# Patient Record
Sex: Female | Born: 1975 | Hispanic: No | Smoking: Never smoker
Health system: Southern US, Community
[De-identification: ages and names within clinical notes are randomized; demographics above are authoritative.]

## PROBLEM LIST (undated history)

## (undated) DIAGNOSIS — T7840XA Allergy, unspecified, initial encounter: Secondary | ICD-10-CM

## (undated) DIAGNOSIS — K219 Gastro-esophageal reflux disease without esophagitis: Secondary | ICD-10-CM

## (undated) HISTORY — DX: Allergy, unspecified, initial encounter: T78.40XA

## (undated) HISTORY — DX: Gastro-esophageal reflux disease without esophagitis: K21.9

## (undated) HISTORY — PX: UPPER GASTROINTESTINAL ENDOSCOPY: SHX188

## (undated) HISTORY — PX: WISDOM TOOTH EXTRACTION: SHX21

---

## 1998-12-20 ENCOUNTER — Other Ambulatory Visit: Admission: RE | Admit: 1998-12-20 | Discharge: 1998-12-20 | Payer: Self-pay | Admitting: Obstetrics and Gynecology

## 2000-05-22 ENCOUNTER — Other Ambulatory Visit: Admission: RE | Admit: 2000-05-22 | Discharge: 2000-05-22 | Payer: Self-pay | Admitting: Obstetrics and Gynecology

## 2013-04-29 ENCOUNTER — Encounter: Payer: Self-pay | Admitting: *Deleted

## 2013-05-06 ENCOUNTER — Ambulatory Visit (INDEPENDENT_AMBULATORY_CARE_PROVIDER_SITE_OTHER): Payer: 59 | Admitting: Family Medicine

## 2013-05-06 ENCOUNTER — Encounter: Payer: Self-pay | Admitting: Family Medicine

## 2013-05-06 VITALS — BP 110/60 | Ht 63.0 in | Wt 92.6 lb

## 2013-05-06 DIAGNOSIS — J309 Allergic rhinitis, unspecified: Secondary | ICD-10-CM | POA: Insufficient documentation

## 2013-05-06 MED ORDER — FLUTICASONE PROPIONATE 50 MCG/ACT NA SUSP: 1.0000 | Freq: Every day | NASAL | Status: DC

## 2013-05-06 NOTE — Progress Notes (Signed)
  Subjective:    Patient ID: Katie Hernandez, female    DOB: 07/31/1976, 37 y.o.   MRN: [id number]  HPI claritin prn for allergies. Generally good control of symptoms  History of ear infections in the past. At times develops pressure in the year. Had history of cerumen impaction also. Recent pressure would like ears checked.  Review of Systems No chest pain no shortness of breath no chronic cough ROS otherwise negative    Objective:   Physical Exam  Alert HEENT slight nasal congestion. External canal is open tympanic membranes normal pharynx normal neck supple. Lungs clear. Heart regular rate and rhythm.      Assessment & Plan:  Impression allergic rhinitis-discussed. Plan add Flonase when necessary. Symptomatic care discussed. WSL

## 2013-05-06 NOTE — Patient Instructions (Signed)
Be sure to get an annual wellness exam

## 2013-10-26 ENCOUNTER — Encounter: Payer: Self-pay | Admitting: Family Medicine

## 2013-10-26 ENCOUNTER — Ambulatory Visit (INDEPENDENT_AMBULATORY_CARE_PROVIDER_SITE_OTHER): Payer: 59 | Admitting: Family Medicine

## 2013-10-26 VITALS — BP 122/80 | Temp 98.8°F | Ht 63.0 in | Wt 94.5 lb

## 2013-10-26 DIAGNOSIS — F458 Other somatoform disorders: Secondary | ICD-10-CM | POA: Insufficient documentation

## 2013-10-26 DIAGNOSIS — M26629 Arthralgia of temporomandibular joint, unspecified side: Secondary | ICD-10-CM

## 2013-10-26 MED ORDER — AMOXICILLIN 500 MG PO CAPS: 500.0000 mg | ORAL_CAPSULE | Freq: Three times a day (TID) | ORAL | Status: DC

## 2013-10-26 NOTE — Progress Notes (Signed)
   Subjective:    Patient ID: Katie Hernandez, female    DOB: 03-31-1976, 38 y.o.   MRN: [id number]  HPI Patient states that she has been experiencing "popping in her head" for about 1 week now. She states no fever, drainage, cough, congestion, etc is noted at this time.   Had diarrheal illnes in the past  Ear and head popping sensation  Talking often feels the pop  Can feel and hear it at the sme time  Turning   No pain or discomfort  Nose blocked up at times, some cong  Allergies mostly spring and fall, some cong at times  Feels grinding sens in left, also feel it in the head And in no chest pain  Review of Systems No chest pain no back pain no abdominal pain no change in bowel habits no blood in stool no rash    Objective:   Physical Exam Anxious appearing female in no major distress. TMs normal. Slight crepitations with extension jaw pharynx normal neck supple. Lungs clear heart regular rate and rhythm. Slight nasal discharge.       Assessment & Plan:  Impression 1 possible rhinosinusitis though doubt major component. 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 Discussed at profound length. Plan patient has decided to get back with her dentist on a mouth guard. Aleve 2 tablets twice a day with food during flare. Antibiotics prescribed for possible sinus component. Highly doubt any others he serious etiology going on. Easily 25 minutes spent in lengthy discussion regarding this

## 2014-05-17 ENCOUNTER — Other Ambulatory Visit: Payer: Self-pay | Admitting: Family Medicine

## 2014-12-06 ENCOUNTER — Encounter: Payer: Self-pay | Admitting: Family Medicine

## 2014-12-06 ENCOUNTER — Ambulatory Visit (INDEPENDENT_AMBULATORY_CARE_PROVIDER_SITE_OTHER): Payer: 59 | Admitting: Nurse Practitioner

## 2014-12-06 ENCOUNTER — Encounter: Payer: Self-pay | Admitting: Nurse Practitioner

## 2014-12-06 VITALS — BP 108/80 | Temp 99.1°F | Ht 63.0 in | Wt 97.0 lb

## 2014-12-06 DIAGNOSIS — J111 Influenza due to unidentified influenza virus with other respiratory manifestations: Secondary | ICD-10-CM | POA: Diagnosis not present

## 2014-12-06 DIAGNOSIS — N912 Amenorrhea, unspecified: Secondary | ICD-10-CM | POA: Diagnosis not present

## 2014-12-06 LAB — POCT URINE PREGNANCY: Preg Test, Ur: NEGATIVE

## 2014-12-06 MED ORDER — HYDROCODONE-HOMATROPINE 5-1.5 MG/5ML PO SYRP: 5.0000 mL | ORAL_SOLUTION | ORAL | Status: DC | PRN

## 2014-12-06 MED ORDER — OSELTAMIVIR PHOSPHATE 75 MG PO CAPS: 75.0000 mg | ORAL_CAPSULE | Freq: Two times a day (BID) | ORAL | Status: DC

## 2014-12-06 NOTE — Progress Notes (Signed)
Subjective:  Presents for c/o sudden onset fever, cough and headache that started yesterday. Fatigue. No sore throat, ear pain or wheezing. Had flu vaccine.   Objective:   BP 108/80 mmHg  Temp(Src) 99.1 F (37.3 C)  Ht 5\' 3"  (1.6 m)  Wt 97 lb (43.999 kg)  BMI 17.19 kg/m2 NAD. Alert, oriented. Fatigued in appearance. TMs left nl; right retracted; normal color. Pharynx clear. Neck supple with mild adenopathy. Lungs clear. Heart RRR.  Assessment: Influenza  Amenorrhea - Plan: POCT Pregnancy, Urine, POCT Pregnancy, Urine, POCT urine pregnancy  Plan:  Meds ordered this encounter  Medications  . oseltamivir (TAMIFLU) 75 MG capsule    Sig: Take 1 capsule (75 mg total) by mouth 2 (two) times daily.    Dispense:  10 capsule    Refill:  0    Order Specific Question:  Supervising Provider    Answer:  Merlyn AlbertLUKING, WILLIAM S [[id number]]  . HYDROcodone-homatropine (HYCODAN) 5-1.5 MG/5ML syrup    Sig: Take 5 mLs by mouth every 4 (four) hours as needed.    Dispense:  120 mL    Refill:  0    Order Specific Question:  Supervising Provider    Answer:  Merlyn AlbertLUKING, WILLIAM S [[id number]]   OTC meds as directed. Reviewed symptomatic care and warning signs. Call back if worsens or persists.

## 2015-04-21 ENCOUNTER — Ambulatory Visit (INDEPENDENT_AMBULATORY_CARE_PROVIDER_SITE_OTHER): Payer: 59 | Admitting: Nurse Practitioner

## 2015-04-21 ENCOUNTER — Telehealth: Payer: Self-pay | Admitting: Nurse Practitioner

## 2015-04-21 ENCOUNTER — Encounter: Payer: Self-pay | Admitting: Nurse Practitioner

## 2015-04-21 VITALS — BP 102/76 | Ht 64.0 in | Wt 95.8 lb

## 2015-04-21 DIAGNOSIS — Z0000 Encounter for general adult medical examination without abnormal findings: Secondary | ICD-10-CM | POA: Diagnosis not present

## 2015-04-21 DIAGNOSIS — R5383 Other fatigue: Secondary | ICD-10-CM | POA: Diagnosis not present

## 2015-04-21 DIAGNOSIS — Z01419 Encounter for gynecological examination (general) (routine) without abnormal findings: Secondary | ICD-10-CM

## 2015-04-21 DIAGNOSIS — Z124 Encounter for screening for malignant neoplasm of cervix: Secondary | ICD-10-CM

## 2015-04-21 NOTE — Telephone Encounter (Signed)
Pt had PE this morning but forgot her form for [employer], brought it  Back in to be filled out. Placed on your desk to be filled out. Please  Call pt when ready for pick up

## 2015-04-21 NOTE — Telephone Encounter (Signed)
done

## 2015-04-22 ENCOUNTER — Encounter: Payer: Self-pay | Admitting: Nurse Practitioner

## 2015-04-22 LAB — LIPID PANEL
CHOL/HDL RATIO: 3.3 ratio (ref 0.0–4.4)
Cholesterol, Total: 212 mg/dL — ABNORMAL HIGH (ref 100–199)
HDL: 65 mg/dL (ref >39)
LDL CALC: 135 mg/dL — AB (ref 0–99)
TRIGLYCERIDES: 58 mg/dL (ref 0–149)
VLDL Cholesterol Cal: 12 mg/dL (ref 5–40)

## 2015-04-22 LAB — HEPATIC FUNCTION PANEL
ALBUMIN: 4.6 g/dL (ref 3.5–5.5)
ALK PHOS: 64 IU/L (ref 39–117)
ALT: 21 IU/L (ref 0–32)
AST: 19 IU/L (ref 0–40)
BILIRUBIN TOTAL: 0.3 mg/dL (ref 0.0–1.2)
Bilirubin, Direct: 0.06 mg/dL (ref 0.00–0.40)
TOTAL PROTEIN: 7.6 g/dL (ref 6.0–8.5)

## 2015-04-22 LAB — CBC WITH DIFFERENTIAL/PLATELET
BASOS ABS: 0 10*3/uL (ref 0.0–0.2)
Basos: 0 %
EOS (ABSOLUTE): 0.1 10*3/uL (ref 0.0–0.4)
Eos: 2 %
HEMATOCRIT: 41.7 % (ref 34.0–46.6)
HEMOGLOBIN: 13.4 g/dL (ref 11.1–15.9)
IMMATURE GRANULOCYTES: 0 %
Immature Grans (Abs): 0 10*3/uL (ref 0.0–0.1)
LYMPHS ABS: 1.9 10*3/uL (ref 0.7–3.1)
LYMPHS: 26 %
MCH: 28.9 pg (ref 26.6–33.0)
MCHC: 32.1 g/dL (ref 31.5–35.7)
MCV: 90 fL (ref 79–97)
MONOCYTES: 7 %
Monocytes Absolute: 0.5 10*3/uL (ref 0.1–0.9)
Neutrophils Absolute: 4.6 10*3/uL (ref 1.4–7.0)
Neutrophils: 65 %
Platelets: 334 10*3/uL (ref 150–379)
RBC: 4.63 x10E6/uL (ref 3.77–5.28)
RDW: 13.4 % (ref 12.3–15.4)
WBC: 7.2 10*3/uL (ref 3.4–10.8)

## 2015-04-22 LAB — BASIC METABOLIC PANEL
BUN/Creatinine Ratio: 15 (ref 8–20)
BUN: 11 mg/dL (ref 6–20)
CO2: 22 mmol/L (ref 18–29)
CREATININE: 0.71 mg/dL (ref 0.57–1.00)
Calcium: 9.6 mg/dL (ref 8.7–10.2)
Chloride: 100 mmol/L (ref 97–108)
GFR, EST AFRICAN AMERICAN: 124 mL/min/{1.73_m2} (ref >59)
GFR, EST NON AFRICAN AMERICAN: 108 mL/min/{1.73_m2} (ref >59)
Glucose: 94 mg/dL (ref 65–99)
POTASSIUM: 4.4 mmol/L (ref 3.5–5.2)
SODIUM: 140 mmol/L (ref 134–144)

## 2015-04-22 LAB — TSH: TSH: 1.33 u[IU]/mL (ref 0.450–4.500)

## 2015-04-22 LAB — VITAMIN D 25 HYDROXY (VIT D DEFICIENCY, FRACTURES): VIT D 25 HYDROXY: 34.5 ng/mL (ref 30.0–100.0)

## 2015-04-22 NOTE — Progress Notes (Signed)
   Subjective:    Patient ID: Katie Hernandez, female    DOB: June 20, 1976, 39 y.o.   MRN: [id number]  HPI presents for her wellness exam. Married, same sexual partner. Regular menses, normal flow. Never uses BC, has not been able to get pregnant. Healthy diet. Started exercise regimen. Regular vision and dental care.     Review of Systems  Constitutional: Positive for fatigue. Negative for fever, activity change and appetite change.  HENT: Negative for dental problem, ear pain, sinus pressure and sore throat.   Respiratory: Negative for cough, chest tightness, shortness of breath and wheezing.   Cardiovascular: Negative for chest pain.  Gastrointestinal: Negative for nausea, vomiting, abdominal pain, diarrhea, constipation and abdominal distention.  Genitourinary: Negative for dysuria, urgency, frequency, vaginal discharge, enuresis, difficulty urinating, genital sores, menstrual problem and pelvic pain.       Objective:   Physical Exam  Constitutional: She is oriented to person, place, and time. She appears well-developed. No distress.  HENT:  Right Ear: External ear normal.  Left Ear: External ear normal.  Mouth/Throat: Oropharynx is clear and moist.  Neck: Normal range of motion. Neck supple. No tracheal deviation present. No thyromegaly present.  Cardiovascular: Normal rate, regular rhythm and normal heart sounds.  Exam reveals no gallop.   No murmur heard. Pulmonary/Chest: Effort normal and breath sounds normal.  Abdominal: Soft. She exhibits no distension. There is no tenderness.  Genitourinary: Vagina normal and uterus normal. No vaginal discharge found.  External GU: no rashes or lesions. Vagina: no discharge. Cervix normal in appearance. No CMT. Bimanual exam: no tenderness or obvious masses.   Musculoskeletal: She exhibits no edema.  Lymphadenopathy:    She has no cervical adenopathy.  Neurological: She is alert and oriented to person, place, and time.  Skin: Skin is warm and  dry. No rash noted.  Psychiatric: She has a normal mood and affect. Her behavior is normal.  Vitals reviewed. Breast exam: areas of dense tissue; no masses; axillae no adenopathy.         Assessment & Plan:  Well woman exam - Plan: Pap IG w/ reflex to HPV when ASC-U, CBC with Differential/Platelet, Lipid panel, Hepatic function panel, Basic metabolic panel, TSH, Vit D  25 hydroxy (rtn osteoporosis monitoring)  Screening for cervical cancer - Plan: Pap IG w/ reflex to HPV when ASC-U  Other fatigue - Plan: CBC with Differential/Platelet, TSH, Vit D  25 hydroxy (rtn osteoporosis monitoring)  Return in about 1 year (around 04/20/2016) for physical.

## 2015-04-25 LAB — PAP IG W/ RFLX HPV ASCU: PAP SMEAR COMMENT: 0

## 2015-11-04 DIAGNOSIS — H5213 Myopia, bilateral: Secondary | ICD-10-CM | POA: Diagnosis not present

## 2015-11-04 DIAGNOSIS — H52223 Regular astigmatism, bilateral: Secondary | ICD-10-CM | POA: Diagnosis not present

## 2015-11-24 ENCOUNTER — Ambulatory Visit (INDEPENDENT_AMBULATORY_CARE_PROVIDER_SITE_OTHER): Payer: 59 | Admitting: Family Medicine

## 2015-11-24 ENCOUNTER — Encounter: Payer: Self-pay | Admitting: Family Medicine

## 2015-11-24 VITALS — BP 104/70 | Temp 98.5°F | Ht 63.0 in | Wt 98.4 lb

## 2015-11-24 DIAGNOSIS — R109 Unspecified abdominal pain: Secondary | ICD-10-CM | POA: Diagnosis not present

## 2015-11-24 LAB — POCT URINALYSIS DIPSTICK
Glucose, UA: NEGATIVE
KETONES UA: NEGATIVE
LEUKOCYTES UA: NEGATIVE
PH UA: 5
RBC UA: NEGATIVE
Spec Grav, UA: <=1.005

## 2015-11-24 MED ORDER — PANTOPRAZOLE SODIUM 40 MG PO TBEC: 40.0000 mg | DELAYED_RELEASE_TABLET | Freq: Every day | ORAL | Status: DC

## 2015-11-24 NOTE — Patient Instructions (Signed)
Please start miralax one scoop daily

## 2015-11-24 NOTE — Progress Notes (Signed)
   Subjective:    Patient ID: Katie Hernandez, female    DOB: 04/20/1976, 40 y.o.   MRN: [id number]  Abdominal Pain This is a new problem. The current episode started in the past 7 days. The pain is located in the generalized abdominal region and RUQ. Associated symptoms include constipation. Treatments tried: Aleve.   Pain very terrible this mornign   First started tue on the way to work  tue started rather suddenly  Bowels not doing so well   Constipated over the past week ,  Often can get severe pain with it, pt is a vegetarian has had a little b m but not much  No fever  No urin symptoms   ruq tend manily now   Pain was worse around the abdomen    No known hx of g b symtoms     Results for orders placed or performed in visit on 11/24/15  POCT urinalysis dipstick  Result Value Ref Range   Color, UA yellow    Clarity, UA clear    Glucose, UA Negative    Bilirubin, UA     Ketones, UA Negative    Spec Grav, UA <=1.005    Blood, UA Negative    pH, UA 5.0    Protein, UA     Urobilinogen, UA     Nitrite, UA     Leukocytes, UA Negative Negative    pain started diffusely abdomen quite severe in nature. Patient almost decided to go to the emergency room and one point. Now more in the right upper quadrant. On further history he has had constipation off-and-on for some time. No dysuria no fever no nausea. No anti-inflammatory medicine use. No alcohol use or abuse. No known history of gallbladder disease   Patient states no other concerns this visit.  Review of Systems  Gastrointestinal: Positive for abdominal pain and constipation.    no rash no dysuria ROS otherwise negative    Objective:   Physical Exam   alert vital stable  No apparent distress talkativelungs clear heart rhythm H&T normal abdomen right upper quadrant tenderness no rebound no guarding no CVA tenderness      Assessment & Plan:   impression 1  sobacute abdominal pain and background of  intermittent chronic abdominal discomfort.  severity not very impressive this evening. And decided not to some emergency room tonight for in the ER workup. Not warranted discussed.Component of constipation is present. In addition pain is localized right upper quadrant at this time very long discussion held 25 minutes most in discussion plan right upper quadrant ultrasound. X-ray of abdomen. Marital asked one scoop daily. Protonix 40 mg daily.  Blood work tomorrow at same time this [employer]

## 2015-11-25 ENCOUNTER — Other Ambulatory Visit (HOSPITAL_COMMUNITY)
Admission: RE | Admit: 2015-11-25 | Discharge: 2015-11-25 | Disposition: A | Discharge status: Home / Self Care | Payer: 59 | Source: Ambulatory Visit | Attending: Family Medicine | Admitting: Family Medicine

## 2015-11-25 ENCOUNTER — Other Ambulatory Visit: Payer: Self-pay

## 2015-11-25 ENCOUNTER — Ambulatory Visit (HOSPITAL_COMMUNITY)
Admission: RE | Admit: 2015-11-25 | Discharge: 2015-11-25 | Disposition: A | Discharge status: Home / Self Care | Payer: 59 | Source: Ambulatory Visit | Attending: Family Medicine | Admitting: Family Medicine

## 2015-11-25 ENCOUNTER — Ambulatory Visit: Payer: 59 | Admitting: Family Medicine

## 2015-11-25 DIAGNOSIS — R1011 Right upper quadrant pain: Secondary | ICD-10-CM | POA: Diagnosis not present

## 2015-11-25 DIAGNOSIS — R1031 Right lower quadrant pain: Secondary | ICD-10-CM | POA: Diagnosis not present

## 2015-11-25 DIAGNOSIS — R109 Unspecified abdominal pain: Secondary | ICD-10-CM | POA: Insufficient documentation

## 2015-11-25 LAB — CBC WITH DIFFERENTIAL/PLATELET
BASOS ABS: 0 10*3/uL (ref 0.0–0.1)
BASOS PCT: 0 %
Eosinophils Absolute: 0.1 10*3/uL (ref 0.0–0.7)
Eosinophils Relative: 1 %
HEMATOCRIT: 43.9 % (ref 36.0–46.0)
Hemoglobin: 13.7 g/dL (ref 12.0–15.0)
Lymphocytes Relative: 22 %
Lymphs Abs: 1.6 10*3/uL (ref 0.7–4.0)
MCH: 28.5 pg (ref 26.0–34.0)
MCHC: 31.2 g/dL (ref 30.0–36.0)
MCV: 91.5 fL (ref 78.0–100.0)
MONO ABS: 0.5 10*3/uL (ref 0.1–1.0)
Monocytes Relative: 7 %
NEUTROS ABS: 5.2 10*3/uL (ref 1.7–7.7)
NEUTROS PCT: 70 %
Platelets: 334 10*3/uL (ref 150–400)
RBC: 4.8 MIL/uL (ref 3.87–5.11)
RDW: 13.6 % (ref 11.5–15.5)
WBC: 7.4 10*3/uL (ref 4.0–10.5)

## 2015-11-25 LAB — HEPATIC FUNCTION PANEL
ALBUMIN: 5 g/dL (ref 3.5–5.0)
ALT: 18 U/L (ref 14–54)
AST: 21 U/L (ref 15–41)
Alkaline Phosphatase: 63 U/L (ref 38–126)
BILIRUBIN TOTAL: 0.8 mg/dL (ref 0.3–1.2)
Bilirubin, Direct: 0.1 mg/dL (ref 0.1–0.5)
Indirect Bilirubin: 0.7 mg/dL (ref 0.3–0.9)
TOTAL PROTEIN: 8.3 g/dL — AB (ref 6.5–8.1)

## 2015-11-25 LAB — AMYLASE: AMYLASE: 75 U/L (ref 28–100)

## 2015-11-25 LAB — LIPASE, BLOOD: Lipase: 48 U/L (ref 11–51)

## 2015-11-26 LAB — H. PYLORI ANTIBODY, IGG

## 2016-04-04 ENCOUNTER — Telehealth: Payer: Self-pay | Admitting: *Deleted

## 2016-04-04 ENCOUNTER — Other Ambulatory Visit: Payer: Self-pay | Admitting: *Deleted

## 2016-04-04 MED ORDER — HYDROCORTISONE 2.5 % EX CREA: TOPICAL_CREAM | Freq: Two times a day (BID) | CUTANEOUS | Status: DC

## 2016-04-04 NOTE — Telephone Encounter (Signed)
rx sent to pharm. Mother notified 

## 2016-04-04 NOTE — Telephone Encounter (Signed)
hydrocort 2.5 % cr sixty g bid affected area for both one ref

## 2016-04-04 NOTE — Telephone Encounter (Signed)
Pt has been at the beach and thinks she has a heat rash. Little red bumbs. Itching on arms and legs. Daughter has same rash. No fever. [facility] [place]

## 2016-06-18 ENCOUNTER — Encounter: Payer: Self-pay | Admitting: Family Medicine

## 2016-06-18 ENCOUNTER — Ambulatory Visit (INDEPENDENT_AMBULATORY_CARE_PROVIDER_SITE_OTHER): Payer: 59 | Admitting: Family Medicine

## 2016-06-18 VITALS — BP 98/64 | Temp 98.1°F | Ht 63.0 in | Wt 100.6 lb

## 2016-06-18 DIAGNOSIS — J040 Acute laryngitis: Secondary | ICD-10-CM | POA: Diagnosis not present

## 2016-06-18 DIAGNOSIS — J310 Chronic rhinitis: Secondary | ICD-10-CM

## 2016-06-18 DIAGNOSIS — J329 Chronic sinusitis, unspecified: Secondary | ICD-10-CM | POA: Diagnosis not present

## 2016-06-18 MED ORDER — AZITHROMYCIN 250 MG PO TABS: ORAL_TABLET | ORAL | 0 refills | Status: DC

## 2016-06-18 NOTE — Progress Notes (Signed)
   Subjective:    Patient ID: Katie Hernandez, female    DOB: [birth year], 40 y.o.   MRN: [id number]  Sinusitis  This is a new problem. The current episode started yesterday. Associated symptoms include congestion, coughing, headaches and a sore throat. (Loss of voice) Treatments tried: nasal spray.  voice yest kicked in swolen  Cough off and on  No major secretion s  clar d prn , some nasal spray   off and on with otc meds   Temples frontal h a   Neg tem    Review of Systems  HENT: Positive for congestion and sore throat.   Respiratory: Positive for cough.   Neurological: Positive for headaches.       Objective:   Physical Exam  Alert, mild malaise. Hydration good Vitals stable. frontal/ maxillary tenderness evident positive nasal congestion. pharynx normal neck supple  lungs clear/no crackles or wheezes. heart regular in rhythm Plus substantial hoarseness      Assessment & Plan:  Impression rhinosinusitis likely post viral, discussed with patient. plan antibiotics prescribed. Questions answered. Symptomatic care discussed. warning signs discussed. WSL

## 2017-04-29 ENCOUNTER — Telehealth: Payer: Self-pay | Admitting: Nurse Practitioner

## 2017-04-29 ENCOUNTER — Ambulatory Visit (INDEPENDENT_AMBULATORY_CARE_PROVIDER_SITE_OTHER): Payer: 59 | Admitting: Nurse Practitioner

## 2017-04-29 ENCOUNTER — Encounter: Payer: Self-pay | Admitting: Nurse Practitioner

## 2017-04-29 VITALS — BP 100/70 | Ht 63.0 in | Wt 100.2 lb

## 2017-04-29 DIAGNOSIS — Z01419 Encounter for gynecological examination (general) (routine) without abnormal findings: Secondary | ICD-10-CM | POA: Diagnosis not present

## 2017-04-29 DIAGNOSIS — J0100 Acute maxillary sinusitis, unspecified: Secondary | ICD-10-CM

## 2017-04-29 DIAGNOSIS — K219 Gastro-esophageal reflux disease without esophagitis: Secondary | ICD-10-CM | POA: Diagnosis not present

## 2017-04-29 MED ORDER — FLUTICASONE PROPIONATE 50 MCG/ACT NA SUSP: 2.0000 | Freq: Every day | NASAL | 5 refills | Status: DC

## 2017-04-29 MED ORDER — AZITHROMYCIN 250 MG PO TABS: ORAL_TABLET | ORAL | 0 refills | Status: DC

## 2017-04-29 MED ORDER — PANTOPRAZOLE SODIUM 40 MG PO TBEC: 40.0000 mg | DELAYED_RELEASE_TABLET | Freq: Every day | ORAL | 2 refills | Status: DC

## 2017-04-29 NOTE — Telephone Encounter (Signed)
Pt will also need refills on  fluticasone (FLONASE) 50 MCG/ACT nasal spray  pantoprazole (PROTONIX) 40 MG tablet   [facility] [place]

## 2017-04-29 NOTE — Progress Notes (Signed)
Subjective:    Patient ID: Katie Hernandez, female    DOB: [birth year], 41 y.o.   MRN: [id number]  HPI presents for her wellness exam. Married, same sexual partner. Does not use birth control. Cycles regular with normal flow. Mild headache around time of cycle. Regular vision and dental care. Not exercising as much as she has in the past. Takes Protonix only as needed. Rare reflux only with certain foods.    Review of Systems  Constitutional: Negative for activity change, appetite change, fatigue and fever.  HENT: Positive for postnasal drip, sinus pain and sinus pressure. Negative for dental problem, ear pain and sore throat.        Maxillary area sinus pressure  Respiratory: Positive for cough. Negative for chest tightness, shortness of breath and wheezing.        Slight cough.   Cardiovascular: Negative for chest pain.  Gastrointestinal: Negative for abdominal distention, abdominal pain, constipation, diarrhea, nausea and vomiting.  Genitourinary: Negative for difficulty urinating, dysuria, enuresis, frequency, genital sores, menstrual problem, pelvic pain, urgency and vaginal discharge.       Objective:   Physical Exam  Constitutional: She is oriented to person, place, and time. She appears well-developed. No distress.  HENT:  Right Ear: External ear normal.  Left Ear: External ear normal.  Mouth/Throat: Oropharynx is clear and moist.  Neck: Normal range of motion. Neck supple. No tracheal deviation present. No thyromegaly present.  Cardiovascular: Normal rate, regular rhythm and normal heart sounds.  Exam reveals no gallop.   No murmur heard. Pulmonary/Chest: Effort normal and breath sounds normal.  Abdominal: Soft. She exhibits no distension. There is no tenderness.  Genitourinary: Vagina normal and uterus normal. No vaginal discharge found.  Genitourinary Comments: External GU no rashes or lesions. Vagina no discharge. Bimanual exam no tenderness or obvious masses.    Musculoskeletal: She exhibits no edema.  Lymphadenopathy:    She has no cervical adenopathy.  Neurological: She is alert and oriented to person, place, and time.  Skin: Skin is warm and dry. No rash noted.  Psychiatric: She has a normal mood and affect. Her behavior is normal.  Vitals reviewed. Breast exam: Slightly dense tissue. No masses noted. Axillae no adenopathy.        Assessment & Plan:   Problem List Items Addressed This Visit      Digestive   Gastroesophageal reflux disease without esophagitis   Relevant Medications   pantoprazole (PROTONIX) 40 MG tablet    Other Visit Diagnoses    Well woman exam    -  Primary   Relevant Orders   Lipid panel   Hepatic function panel   Basic metabolic panel   VITAMIN D 25 Hydroxy (Vit-D Deficiency, Fractures)   Acute non-recurrent maxillary sinusitis       Relevant Medications   azithromycin (ZITHROMAX Z-PAK) 250 MG tablet   fluticasone (FLONASE) 50 MCG/ACT nasal spray      Meds ordered this encounter  Medications  . azithromycin (ZITHROMAX Z-PAK) 250 MG tablet    Sig: Take 2 tablets (500 mg) on  Day 1,  followed by 1 tablet (250 mg) once daily on Days 2 through 5.    Dispense:  6 each    Refill:  0    Order Specific Question:   Supervising Provider    Answer:   Merlyn Albert [[id number]]  . fluticasone (FLONASE) 50 MCG/ACT nasal spray    Sig: Place 2 sprays into both nostrils daily. Prn head congestion  Dispense:  16 g    Refill:  5    Order Specific Question:   Supervising Provider    Answer:   Merlyn AlbertLUKING, WILLIAM S [[id number]]  . pantoprazole (PROTONIX) 40 MG tablet    Sig: Take 1 tablet (40 mg total) by mouth daily. Prn acid reflux    Dispense:  30 tablet    Refill:  2    Order Specific Question:   Supervising Provider    Answer:   Merlyn AlbertLUKING, WILLIAM S [[id number]]   Avoid foods that cause acid reflux. Continue OTC meds as directed for sinus pressure. Strongly recommend patient consider mammogram but defers at this time. States  that she will think about it. Return in about 1 year (around 04/29/2018) for physical. Call back sooner if needed.

## 2017-04-29 NOTE — Telephone Encounter (Signed)
Seen today. 

## 2017-04-29 NOTE — Telephone Encounter (Signed)
done

## 2017-05-01 ENCOUNTER — Encounter: Payer: Self-pay | Admitting: Nurse Practitioner

## 2017-05-01 DIAGNOSIS — K219 Gastro-esophageal reflux disease without esophagitis: Secondary | ICD-10-CM | POA: Insufficient documentation

## 2017-05-01 DIAGNOSIS — Z01419 Encounter for gynecological examination (general) (routine) without abnormal findings: Secondary | ICD-10-CM | POA: Diagnosis not present

## 2017-05-02 ENCOUNTER — Encounter: Payer: Self-pay | Admitting: Nurse Practitioner

## 2017-05-02 LAB — LIPID PANEL
CHOLESTEROL TOTAL: 181 mg/dL (ref 100–199)
Chol/HDL Ratio: 3.7 ratio (ref 0.0–4.4)
HDL: 49 mg/dL (ref >39)
LDL Calculated: 120 mg/dL — ABNORMAL HIGH (ref 0–99)
Triglycerides: 60 mg/dL (ref 0–149)
VLDL CHOLESTEROL CAL: 12 mg/dL (ref 5–40)

## 2017-05-02 LAB — BASIC METABOLIC PANEL
BUN/Creatinine Ratio: 14 (ref 9–23)
BUN: 10 mg/dL (ref 6–24)
CALCIUM: 9.6 mg/dL (ref 8.7–10.2)
CO2: 24 mmol/L (ref 20–29)
Chloride: 100 mmol/L (ref 96–106)
Creatinine, Ser: 0.7 mg/dL (ref 0.57–1.00)
GFR calc Af Amer: 124 mL/min/{1.73_m2} (ref >59)
GFR calc non Af Amer: 108 mL/min/{1.73_m2} (ref >59)
GLUCOSE: 94 mg/dL (ref 65–99)
POTASSIUM: 4.6 mmol/L (ref 3.5–5.2)
SODIUM: 138 mmol/L (ref 134–144)

## 2017-05-02 LAB — HEPATIC FUNCTION PANEL
ALK PHOS: 67 IU/L (ref 39–117)
ALT: 14 IU/L (ref 0–32)
AST: 16 IU/L (ref 0–40)
Albumin: 4.6 g/dL (ref 3.5–5.5)
Bilirubin Total: 0.5 mg/dL (ref 0.0–1.2)
Bilirubin, Direct: 0.15 mg/dL (ref 0.00–0.40)
TOTAL PROTEIN: 7.3 g/dL (ref 6.0–8.5)

## 2017-05-02 LAB — VITAMIN D 25 HYDROXY (VIT D DEFICIENCY, FRACTURES): VIT D 25 HYDROXY: 30.3 ng/mL (ref 30.0–100.0)

## 2017-07-12 ENCOUNTER — Encounter: Payer: Self-pay | Admitting: Family Medicine

## 2017-07-12 ENCOUNTER — Ambulatory Visit (INDEPENDENT_AMBULATORY_CARE_PROVIDER_SITE_OTHER): Payer: 59 | Admitting: Family Medicine

## 2017-07-12 VITALS — BP 102/62 | Ht 63.0 in | Wt 100.0 lb

## 2017-07-12 DIAGNOSIS — H9202 Otalgia, left ear: Secondary | ICD-10-CM | POA: Diagnosis not present
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
## 2018-07-23 DIAGNOSIS — H5213 Myopia, bilateral: Secondary | ICD-10-CM | POA: Diagnosis not present

## 2018-07-23 DIAGNOSIS — H52223 Regular astigmatism, bilateral: Secondary | ICD-10-CM | POA: Diagnosis not present

## 2018-10-08 ENCOUNTER — Ambulatory Visit: Payer: 59 | Admitting: Family Medicine

## 2018-10-08 ENCOUNTER — Encounter: Payer: Self-pay | Admitting: Family Medicine

## 2018-10-08 VITALS — BP 112/80 | Ht 63.0 in | Wt 102.0 lb

## 2018-10-08 DIAGNOSIS — K219 Gastro-esophageal reflux disease without esophagitis: Secondary | ICD-10-CM | POA: Diagnosis not present

## 2018-10-08 DIAGNOSIS — J019 Acute sinusitis, unspecified: Secondary | ICD-10-CM | POA: Diagnosis not present

## 2018-10-08 MED ORDER — AMOXICILLIN 500 MG PO CAPS: 500.0000 mg | ORAL_CAPSULE | Freq: Three times a day (TID) | ORAL | 0 refills | Status: AC

## 2018-10-08 MED ORDER — PANTOPRAZOLE SODIUM 40 MG PO TBEC: 40.0000 mg | DELAYED_RELEASE_TABLET | Freq: Every day | ORAL | 2 refills | Status: DC

## 2018-10-08 NOTE — Progress Notes (Signed)
   Subjective:    Patient ID: Katie Hernandez, female    DOB: 1976/07/12, 43 y.o.   MRN: [id number]  HPI Patient is here today with complaints of pressure behind eyes.  Also here today with complaints of left ear pain off and on for the last two days. She has been taking Claritin D and ibuprofen, which has helped some.  5 day history of sinus pressure and left ear pain intermittently last 2 days. Reports significant nasal congestion and pressure, no rhinorrhea. No cough. No fever.   Pt also requesting refill of her protonix, states only using prn for reflux, helps control her symptoms well.    Review of Systems  Constitutional: Negative for fever.  HENT: Positive for congestion, ear pain, sinus pressure and sinus pain. Negative for ear discharge and sore throat.   Respiratory: Negative for cough, shortness of breath and wheezing.        Objective:   Physical Exam Vitals signs and nursing note reviewed.  Constitutional:      General: She is not in acute distress.    Appearance: Normal appearance. She is not toxic-appearing.  HENT:     Head: Normocephalic and atraumatic.     Ears:     Comments: Bilateral TM partially obscured by cerumen, part of TM able to be visualized appears pearly gray    Nose: Congestion present.     Right Sinus: Maxillary sinus tenderness and frontal sinus tenderness present.     Left Sinus: Maxillary sinus tenderness and frontal sinus tenderness present.  Eyes:     General:        Right eye: No discharge.        Left eye: No discharge.  Neck:     Musculoskeletal: Neck supple. No neck rigidity.  Cardiovascular:     Rate and Rhythm: Normal rate and regular rhythm.     Heart sounds: Normal heart sounds.  Pulmonary:     Effort: Pulmonary effort is normal. No respiratory distress.     Breath sounds: Normal breath sounds.  Lymphadenopathy:     Cervical: No cervical adenopathy.  Skin:    General: Skin is warm and dry.  Neurological:     Mental Status: She  is alert and oriented to person, place, and time.  Psychiatric:        Mood and Affect: Mood normal.           Assessment & Plan:  1. Acute rhinosinusitis Likely sinusitis, will treat with abx. Symptomatic care discussed. Warning signs discussed. Unable to visualize full TM, but no sign of infection visualized today. Recommended if pt continues to have ear pain she should f/u and may need referral to ENT for cerumen removal.   2. Gastroesophageal reflux disease without esophagitis - Plan: pantoprazole (PROTONIX) 40 MG tablet Pt requesting refill of protonix, states she only takes prn for reflux symptoms and is helpful. Refill given.

## 2018-11-27 ENCOUNTER — Ambulatory Visit (INDEPENDENT_AMBULATORY_CARE_PROVIDER_SITE_OTHER): Payer: 59 | Admitting: Family Medicine

## 2018-11-27 ENCOUNTER — Encounter: Payer: Self-pay | Admitting: Family Medicine

## 2018-11-27 VITALS — BP 118/70 | Ht 62.75 in | Wt 102.8 lb

## 2018-11-27 DIAGNOSIS — Z0000 Encounter for general adult medical examination without abnormal findings: Secondary | ICD-10-CM | POA: Diagnosis not present

## 2018-11-27 DIAGNOSIS — Z13220 Encounter for screening for lipoid disorders: Secondary | ICD-10-CM | POA: Diagnosis not present

## 2018-11-27 DIAGNOSIS — Z1231 Encounter for screening mammogram for malignant neoplasm of breast: Secondary | ICD-10-CM | POA: Diagnosis not present

## 2018-11-27 DIAGNOSIS — Z124 Encounter for screening for malignant neoplasm of cervix: Secondary | ICD-10-CM

## 2018-11-27 NOTE — Patient Instructions (Signed)
Preventive Care 40-64 Years, Female Preventive care refers to lifestyle choices and visits with your health care provider that can promote health and wellness. What does preventive care include?   A yearly physical exam. This is also called an annual well check.  Dental exams once or twice a year.  Routine eye exams. Ask your health care provider how often you should have your eyes checked.  Personal lifestyle choices, including: ? Daily care of your teeth and gums. ? Regular physical activity. ? Eating a healthy diet. ? Avoiding tobacco and drug use. ? Limiting alcohol use. ? Practicing safe sex. ? Taking low-dose aspirin daily starting at age 50. ? Taking vitamin and mineral supplements as recommended by your health care provider. What happens during an annual well check? The services and screenings done by your health care provider during your annual well check will depend on your age, overall health, lifestyle risk factors, and family history of disease. Counseling Your health care provider may ask you questions about your:  Alcohol use.  Tobacco use.  Drug use.  Emotional well-being.  Home and relationship well-being.  Sexual activity.  Eating habits.  Work and work environment.  Method of birth control.  Menstrual cycle.  Pregnancy history. Screening You may have the following tests or measurements:  Height, weight, and BMI.  Blood pressure.  Lipid and cholesterol levels. These may be checked every 5 years, or more frequently if you are over 50 years old.  Skin check.  Lung cancer screening. You may have this screening every year starting at age 55 if you have a 30-pack-year history of smoking and currently smoke or have quit within the past 15 years.  Colorectal cancer screening. All adults should have this screening starting at age 50 and continuing until age 75. Your health care provider may recommend screening at age 45. You will have tests every  1-10 years, depending on your results and the type of screening test. People at increased risk should start screening at an earlier age. Screening tests may include: ? Guaiac-based fecal occult blood testing. ? Fecal immunochemical test (FIT). ? Stool DNA test. ? Virtual colonoscopy. ? Sigmoidoscopy. During this test, a flexible tube with a tiny camera (sigmoidoscope) is used to examine your rectum and lower colon. The sigmoidoscope is inserted through your anus into your rectum and lower colon. ? Colonoscopy. During this test, a long, thin, flexible tube with a tiny camera (colonoscope) is used to examine your entire colon and rectum.  Hepatitis C blood test.  Hepatitis B blood test.  Sexually transmitted disease (STD) testing.  Diabetes screening. This is done by checking your blood sugar (glucose) after you have not eaten for a while (fasting). You may have this done every 1-3 years.  Mammogram. This may be done every 1-2 years. Talk to your health care provider about when you should start having regular mammograms. This may depend on whether you have a family history of breast cancer.  BRCA-related cancer screening. This may be done if you have a family history of breast, ovarian, tubal, or peritoneal cancers.  Pelvic exam and Pap test. This may be done every 3 years starting at age 21. Starting at age 30, this may be done every 5 years if you have a Pap test in combination with an HPV test.  Bone density scan. This is done to screen for osteoporosis. You may have this scan if you are at high risk for osteoporosis. Discuss your test results, treatment options,   and if necessary, the need for more tests with your health care provider. Vaccines Your health care provider may recommend certain vaccines, such as:  Influenza vaccine. This is recommended every year.  Tetanus, diphtheria, and acellular pertussis (Tdap, Td) vaccine. You may need a Td booster every 10 years.  Varicella  vaccine. You may need this if you have not been vaccinated.  Zoster vaccine. You may need this after age 38.  Measles, mumps, and rubella (MMR) vaccine. You may need at least one dose of MMR if you were born in 1957 or later. You may also need a second dose.  Pneumococcal 13-valent conjugate (PCV13) vaccine. You may need this if you have certain conditions and were not previously vaccinated.  Pneumococcal polysaccharide (PPSV23) vaccine. You may need one or two doses if you smoke cigarettes or if you have certain conditions.  Meningococcal vaccine. You may need this if you have certain conditions.  Hepatitis A vaccine. You may need this if you have certain conditions or if you travel or work in places where you may be exposed to hepatitis A.  Hepatitis B vaccine. You may need this if you have certain conditions or if you travel or work in places where you may be exposed to hepatitis B.  Haemophilus influenzae type b (Hib) vaccine. You may need this if you have certain conditions. Talk to your health care provider about which screenings and vaccines you need and how often you need them. This information is not intended to replace advice given to you by your health care provider. Make sure you discuss any questions you have with your health care provider. Document Released: 10/07/2015 Document Revised: 10/31/2017 Document Reviewed: 07/12/2015 Elsevier Interactive Patient Education  2019 [employer].

## 2018-11-27 NOTE — Progress Notes (Signed)
Subjective:    Patient ID: Katie Hernandez, female    DOB: 20-Apr-1976, 43 y.o.   MRN: [id number]  HPI The patient comes in today for a wellness visit.  A review of their health history was completed.  A review of medications was also completed.  Any needed refills; none  Eating habits: health conscious  Falls/  MVA accidents in past few months: none  Regular exercise: squats and sit ups  Specialist pt sees on regular basis: none  Preventative health issues were discussed. Regular vision and dental exams. Has not had mammogram - will get scheduled.   Additional concerns: none  LMP: around 11/10/18, regular cycles; reports noticing h/a prior to starting menses, not noticing every time. Has been ongoing for several months. Sometimes sees "eye squiggles" prior to h/a usually lasts 20-25 minutes, last time a few months ago. No blurred vision. No N/V. Reports photosensitivity. No nighttime awakenings. States ibuprofen helps relieve. Reports long-standing hx of potential migraines but has never been evaluated for these.   Sexually active, same partner. No vaginal discharge or itching. Denies problems. Declines STD screening.   Review of Systems  Constitutional: Negative for chills, fever and unexpected weight change.  HENT: Negative for congestion, ear pain, sinus pain and sore throat.   Eyes: Negative for discharge and visual disturbance.  Respiratory: Negative for cough and shortness of breath.   Cardiovascular: Negative for chest pain and palpitations.  Gastrointestinal: Negative for abdominal pain and blood in stool.  Genitourinary: Negative for difficulty urinating, hematuria, menstrual problem, pelvic pain, vaginal bleeding and vaginal discharge.  Neurological: Negative for dizziness, weakness and numbness.  Hematological: Negative for adenopathy.  Psychiatric/Behavioral: Negative for dysphoric mood and suicidal ideas.       Objective:   Physical Exam Vitals signs and nursing  note reviewed. Exam conducted with a chaperone present.  Constitutional:      General: She is not in acute distress.    Appearance: Normal appearance. She is well-developed. She is not toxic-appearing.  HENT:     Head: Normocephalic and atraumatic.     Right Ear: Tympanic membrane normal.     Left Ear: Tympanic membrane normal.     Nose: Nose normal.     Mouth/Throat:     Mouth: Mucous membranes are moist.     Pharynx: Oropharynx is clear. Uvula midline.  Eyes:     General:        Right eye: No discharge.        Left eye: No discharge.     Extraocular Movements: Extraocular movements intact.     Conjunctiva/sclera: Conjunctivae normal.     Pupils: Pupils are equal, round, and reactive to light.  Neck:     Musculoskeletal: Neck supple.     Thyroid: No thyromegaly.  Cardiovascular:     Rate and Rhythm: Normal rate and regular rhythm.     Heart sounds: Normal heart sounds. No murmur.  Pulmonary:     Effort: Pulmonary effort is normal. No respiratory distress.     Breath sounds: Normal breath sounds. No wheezing.  Chest:     Breasts: Breasts are symmetrical.        Right: Normal.        Left: Normal.  Abdominal:     General: Bowel sounds are normal. There is no distension.     Palpations: Abdomen is soft. There is no mass.     Tenderness: There is no abdominal tenderness.  Genitourinary:    General: Normal  vulva.     Vagina: Normal.     Cervix: No cervical motion tenderness, discharge or erythema.     Uterus: Normal.      Adnexa: Right adnexa normal and left adnexa normal.     Comments: No tenderness or obvious masses on bimanual exam Musculoskeletal:        General: No deformity.  Lymphadenopathy:     Cervical: No cervical adenopathy.     Upper Body:     Right upper body: No supraclavicular or axillary adenopathy.     Left upper body: No supraclavicular or axillary adenopathy.  Skin:    General: Skin is warm and dry.  Neurological:     Mental Status: She is alert and  oriented to person, place, and time.     Coordination: Coordination normal.     Gait: Gait normal.  Psychiatric:        Mood and Affect: Mood normal.        Behavior: Behavior normal.           Assessment & Plan:  1. Routine general medical examination at a health care facility - Plan: Lipid panel, Comprehensive metabolic panel, CBC with Differential/Platelet, MM DIGITAL SCREENING BILATERAL, Pap IG and HPV (high risk) DNA detection Adult wellness-complete.wellness physical was conducted today. Importance of diet and exercise were discussed in detail.  In addition to this a discussion regarding safety was also covered. We also reviewed over immunizations and gave recommendations regarding current immunization needed for age.  In addition to this additional areas were also touched on including: Preventative health exams needed:  Colonoscopy N/A Mammogram: will schedule Pap smear: done today, will notify of results.  Patient was advised yearly wellness exam. Will check basic screening lab work.   2. Screening for lipid disorders - Plan: Lipid panel  3. Screening for cervical cancer  4. Encounter for screening mammogram for breast cancer - Plan: MM DIGITAL SCREENING BILATERAL  5. Headaches Pt with hx of likely migraine h/a, she thinks associated with menstrual cycle. No red flags noted. Recommend she keep a h/a diary and try to identify triggers and bring this back in for f/u appt in 4-6 weeks to further evaluate her symptoms.

## 2018-11-28 ENCOUNTER — Encounter: Payer: Self-pay | Admitting: Family Medicine

## 2018-11-28 LAB — CBC WITH DIFFERENTIAL/PLATELET
BASOS ABS: 0 10*3/uL (ref 0.0–0.2)
BASOS: 0 %
EOS (ABSOLUTE): 0 10*3/uL (ref 0.0–0.4)
EOS: 0 %
HEMATOCRIT: 40 % (ref 34.0–46.6)
HEMOGLOBIN: 13.2 g/dL (ref 11.1–15.9)
IMMATURE GRANS (ABS): 0 10*3/uL (ref 0.0–0.1)
Immature Granulocytes: 0 %
LYMPHS ABS: 1.5 10*3/uL (ref 0.7–3.1)
LYMPHS: 17 %
MCH: 28.9 pg (ref 26.6–33.0)
MCHC: 33 g/dL (ref 31.5–35.7)
MCV: 88 fL (ref 79–97)
MONOCYTES: 6 %
Monocytes Absolute: 0.5 10*3/uL (ref 0.1–0.9)
NEUTROS ABS: 6.8 10*3/uL (ref 1.4–7.0)
NEUTROS PCT: 77 %
Platelets: 397 10*3/uL (ref 150–450)
RBC: 4.56 x10E6/uL (ref 3.77–5.28)
RDW: 12.3 % (ref 11.7–15.4)
WBC: 8.9 10*3/uL (ref 3.4–10.8)

## 2018-11-28 LAB — LIPID PANEL
CHOL/HDL RATIO: 3.3 ratio (ref 0.0–4.4)
Cholesterol, Total: 175 mg/dL (ref 100–199)
HDL: 53 mg/dL (ref >39)
LDL CALC: 111 mg/dL — AB (ref 0–99)
TRIGLYCERIDES: 54 mg/dL (ref 0–149)
VLDL Cholesterol Cal: 11 mg/dL (ref 5–40)

## 2018-11-28 LAB — COMPREHENSIVE METABOLIC PANEL
A/G RATIO: 1.8 (ref 1.2–2.2)
ALT: 20 IU/L (ref 0–32)
AST: 19 IU/L (ref 0–40)
Albumin: 4.7 g/dL (ref 3.8–4.8)
Alkaline Phosphatase: 71 IU/L (ref 39–117)
BUN/Creatinine Ratio: 14 (ref 9–23)
BUN: 9 mg/dL (ref 6–24)
Bilirubin Total: 0.7 mg/dL (ref 0.0–1.2)
CALCIUM: 9.6 mg/dL (ref 8.7–10.2)
CHLORIDE: 102 mmol/L (ref 96–106)
CO2: 24 mmol/L (ref 20–29)
Creatinine, Ser: 0.65 mg/dL (ref 0.57–1.00)
GFR calc Af Amer: 127 mL/min/{1.73_m2} (ref >59)
GFR, EST NON AFRICAN AMERICAN: 110 mL/min/{1.73_m2} (ref >59)
Globulin, Total: 2.6 g/dL (ref 1.5–4.5)
Glucose: 95 mg/dL (ref 65–99)
POTASSIUM: 4.5 mmol/L (ref 3.5–5.2)
Sodium: 140 mmol/L (ref 134–144)
Total Protein: 7.3 g/dL (ref 6.0–8.5)

## 2018-12-03 LAB — PAP IG AND HPV HIGH-RISK: HPV, HIGH-RISK: NEGATIVE

## 2018-12-05 ENCOUNTER — Ambulatory Visit (HOSPITAL_COMMUNITY): Payer: 59

## 2018-12-05 ENCOUNTER — Telehealth: Payer: Self-pay | Admitting: Family Medicine

## 2018-12-05 NOTE — Telephone Encounter (Signed)
Please advise. Thank you

## 2018-12-05 NOTE — Telephone Encounter (Signed)
Dropped off Medical History [employer] form to be filled out, Placed in [employer] in Dr.Steve's box.

## 2018-12-10 ENCOUNTER — Ambulatory Visit (HOSPITAL_COMMUNITY)
Admission: RE | Admit: 2018-12-10 | Discharge: 2018-12-10 | Disposition: A | Discharge status: Home / Self Care | Payer: 59 | Source: Ambulatory Visit | Attending: Family Medicine | Admitting: Family Medicine

## 2018-12-10 ENCOUNTER — Other Ambulatory Visit: Payer: Self-pay

## 2018-12-10 DIAGNOSIS — Z0000 Encounter for general adult medical examination without abnormal findings: Secondary | ICD-10-CM | POA: Insufficient documentation

## 2018-12-10 DIAGNOSIS — Z1231 Encounter for screening mammogram for malignant neoplasm of breast: Secondary | ICD-10-CM | POA: Insufficient documentation

## 2018-12-24 ENCOUNTER — Ambulatory Visit: Payer: 59 | Admitting: Family Medicine

## 2018-12-25 ENCOUNTER — Ambulatory Visit: Payer: 59 | Admitting: Family Medicine

## 2019-01-28 ENCOUNTER — Telehealth: Payer: Self-pay | Admitting: Family Medicine

## 2019-01-28 ENCOUNTER — Other Ambulatory Visit: Payer: Self-pay

## 2019-01-28 MED ORDER — LORATADINE-PSEUDOEPHEDRINE ER 5-120 MG PO TB12: 1.0000 | ORAL_TABLET | ORAL | 5 refills | Status: DC | PRN

## 2019-01-28 MED ORDER — LORATADINE-PSEUDOEPHEDRINE ER 10-240 MG PO TB24: ORAL_TABLET | ORAL | 5 refills | Status: DC

## 2019-01-28 NOTE — Telephone Encounter (Signed)
Wellness on 11/27/18

## 2019-01-28 NOTE — Telephone Encounter (Signed)
Pt states pharmacy told her to get primary care to order Clairitn-D with a prescription so that she can get a month's supply  Pt bought box of 20 & pharmacy states she can't get anymore for 30 days without order from doctor  Please advise & call pt   [facility]

## 2019-01-28 NOTE — Telephone Encounter (Signed)
Medication sent in and pt is aware  

## 2019-01-28 NOTE — Telephone Encounter (Signed)
Sure plus 6 ref 

## 2019-05-13 ENCOUNTER — Telehealth: Payer: Self-pay | Admitting: *Deleted

## 2019-05-13 NOTE — Telephone Encounter (Signed)
Pt dropped off form she needs filled out. Last wellness march 2020. Form in dr steve's folder.

## 2019-05-14 NOTE — Telephone Encounter (Signed)
Form completed and given to pt today

## 2019-05-19 ENCOUNTER — Telehealth: Payer: Self-pay | Admitting: Family Medicine

## 2019-05-19 MED ORDER — FLUTICASONE PROPIONATE 50 MCG/ACT NA SUSP: 2.0000 | Freq: Every day | NASAL | 11 refills | Status: DC

## 2019-05-19 MED FILL — PANTOPRAZOLE SOD DR 40 MG T: 40 | 30 days supply | Qty: 30 | Fill #0

## 2019-05-19 MED FILL — FLUTICASONE PROP 50 MCG SPR: 50 | 30 days supply | Qty: 16 | Fill #0

## 2019-05-19 NOTE — Telephone Encounter (Signed)
Patient is requesting refill on Flonase to be called in [place]

## 2019-05-19 NOTE — Telephone Encounter (Signed)
Ok 1000 ref

## 2019-05-19 NOTE — Telephone Encounter (Signed)
Prescription sent electronically to pharmacy. 

## 2019-08-05 ENCOUNTER — Other Ambulatory Visit: Payer: Self-pay

## 2019-08-05 ENCOUNTER — Encounter: Payer: Self-pay | Admitting: Family Medicine

## 2019-08-05 ENCOUNTER — Ambulatory Visit (INDEPENDENT_AMBULATORY_CARE_PROVIDER_SITE_OTHER): Payer: 59 | Admitting: Family Medicine

## 2019-08-05 DIAGNOSIS — J019 Acute sinusitis, unspecified: Secondary | ICD-10-CM

## 2019-08-05 MED ORDER — AMOXICILLIN-POT CLAVULANATE 875-125 MG PO TABS: ORAL_TABLET | ORAL | 0 refills | Status: DC

## 2019-08-05 NOTE — Progress Notes (Signed)
   Subjective:    Patient ID: Katie Hernandez, female    DOB: 24-Jul-1976, 43 y.o.   MRN: [id number]  Sinus Problem This is a new problem. The current episode started 1 to 4 weeks ago. Associated symptoms include headaches and sinus pressure.      Review of Systems  HENT: Positive for sinus pressure.   Neurological: Positive for headaches.   Virtual Visit via Video Note  I connected with Katie Hernandez on 08/05/19 at  8:40 AM EST by a video enabled telemedicine application and verified that I am speaking with the correct person using two identifiers.  Location: Patient: home Provider: office   I discussed the limitations of evaluation and management by telemedicine and the availability of in person appointments. The patient expressed understanding and agreed to proceed.  History of Present Illness:    Observations/Objective:   Assessment and Plan:   Follow Up Instructions:    I discussed the assessment and treatment plan with the patient. The patient was provided an opportunity to ask questions and all were answered. The patient agreed with the plan and demonstrated an understanding of the instructions.   The patient was advised to call back or seek an in-person evaluation if the symptoms worsen or if the condition fails to improve as anticipated.  I provided 20 minutes of non-face-to-face time during this encounter.  Over a month or so  Over a week has had frontal pressure  Sinus involvent   No onee sick  All up top  No fever         Objective:   Physical Exam  Virtual      Assessment & Plan:  Impression subacute rhinosinusitis.  Discussed.  Augmentin twice daily 10 days.  Flonase use regularly symptom care discussed.  Covid due to protracted nature along with no throat or respiratory features.  Warning signs discussed

## 2019-10-26 ENCOUNTER — Encounter: Payer: Self-pay | Admitting: Family Medicine

## 2020-01-06 ENCOUNTER — Other Ambulatory Visit (HOSPITAL_COMMUNITY): Payer: Self-pay | Admitting: Family Medicine

## 2020-01-06 DIAGNOSIS — Z1231 Encounter for screening mammogram for malignant neoplasm of breast: Secondary | ICD-10-CM

## 2020-01-07 MED FILL — FLUTICASONE PROP 50 MCG SPR: 50 | 30 days supply | Qty: 16 | Fill #1

## 2020-01-08 ENCOUNTER — Other Ambulatory Visit: Payer: Self-pay | Admitting: *Deleted

## 2020-01-08 DIAGNOSIS — K219 Gastro-esophageal reflux disease without esophagitis: Secondary | ICD-10-CM

## 2020-01-08 MED ORDER — PANTOPRAZOLE SODIUM 40 MG PO TBEC: 40.0000 mg | DELAYED_RELEASE_TABLET | Freq: Every day | ORAL | 0 refills | Status: DC

## 2020-01-08 MED FILL — PANTOPRAZOLE SOD DR 40 MG T: 40 | 30 days supply | Qty: 30 | Fill #0

## 2020-01-11 ENCOUNTER — Other Ambulatory Visit: Payer: Self-pay

## 2020-01-11 ENCOUNTER — Ambulatory Visit (HOSPITAL_COMMUNITY)
Admission: RE | Admit: 2020-01-11 | Discharge: 2020-01-11 | Disposition: A | Discharge status: Home / Self Care | Payer: 59 | Source: Ambulatory Visit | Attending: Family Medicine | Admitting: Family Medicine

## 2020-01-11 DIAGNOSIS — Z1231 Encounter for screening mammogram for malignant neoplasm of breast: Secondary | ICD-10-CM | POA: Diagnosis not present

## 2020-01-13 ENCOUNTER — Telehealth: Payer: Self-pay | Admitting: Family Medicine

## 2020-01-13 MED ORDER — FLUTICASONE PROPIONATE 50 MCG/ACT NA SUSP: 2.0000 | Freq: Every day | NASAL | 6 refills | Status: AC

## 2020-01-13 NOTE — Telephone Encounter (Signed)
Patient is requesting refill on flonase to be called into [facility]

## 2020-01-13 NOTE — Telephone Encounter (Signed)
Pt last seen 08/05/2019 for rhinosinusitis. Please advise. Thank you

## 2020-01-13 NOTE — Telephone Encounter (Signed)
Ok plus 6 ref 

## 2020-01-13 NOTE — Telephone Encounter (Signed)
Medication sent to pharmacy and pt is aware 

## 2020-01-27 ENCOUNTER — Telehealth: Payer: Self-pay | Admitting: Family Medicine

## 2020-01-27 NOTE — Telephone Encounter (Signed)
Patient notified

## 2020-01-27 NOTE — Telephone Encounter (Signed)
Madhuri is calling back stating [facility] will not discharge until pt has an appt. She is hoping to know soon so if she needs to call somewhere else she can to get him established.  CB# [phone]

## 2020-01-27 NOTE — Telephone Encounter (Signed)
Sorry no new patients at this time for me. At all. We are in a bit of a challenge on this side too

## 2020-01-27 NOTE — Telephone Encounter (Signed)
Patient is wanting to know if you would take on her new born foster child. I explained to her we were not taking new patient at this time due to Dr.Taylor trying to get establish with your patient but I told her I would send back a message to ask. Katie Hernandez Hunter-12/21/19 ,he is being discharged today from [facility]. He is needing appointment this week . Please advise

## 2020-05-26 ENCOUNTER — Other Ambulatory Visit: Payer: Self-pay

## 2020-05-26 ENCOUNTER — Encounter: Payer: Self-pay | Admitting: Gastroenterology

## 2020-05-26 ENCOUNTER — Encounter: Payer: Self-pay | Admitting: Family Medicine

## 2020-05-26 ENCOUNTER — Ambulatory Visit (INDEPENDENT_AMBULATORY_CARE_PROVIDER_SITE_OTHER): Payer: BC Managed Care – PPO | Admitting: Family Medicine

## 2020-05-26 ENCOUNTER — Telehealth: Payer: Self-pay | Admitting: *Deleted

## 2020-05-26 ENCOUNTER — Other Ambulatory Visit: Payer: Self-pay | Admitting: *Deleted

## 2020-05-26 VITALS — BP 122/74 | HR 87 | Temp 97.2°F | Ht 62.75 in | Wt 107.2 lb

## 2020-05-26 DIAGNOSIS — J302 Other seasonal allergic rhinitis: Secondary | ICD-10-CM

## 2020-05-26 DIAGNOSIS — J019 Acute sinusitis, unspecified: Secondary | ICD-10-CM | POA: Diagnosis not present

## 2020-05-26 DIAGNOSIS — K219 Gastro-esophageal reflux disease without esophagitis: Secondary | ICD-10-CM

## 2020-05-26 MED ORDER — AMOXICILLIN-POT CLAVULANATE 600-42.9 MG/5ML PO SUSR: ORAL | 0 refills | Status: DC

## 2020-05-26 MED ORDER — PANTOPRAZOLE SODIUM 40 MG PO PACK: PACK | ORAL | 5 refills | Status: DC

## 2020-05-26 NOTE — Telephone Encounter (Signed)
Brandon from [facility] calling to confirm dosage of the augmentin.  [phone].

## 2020-05-26 NOTE — Patient Instructions (Signed)
Take the liquid form of Protonix daily Ask the pharmacist for a liquid allergy medication and take daily Take Augmentin for sinus infection. We will start with GI referral.    Barrett's Esophagus  Barrett's esophagus occurs when the tissue that lines the esophagus changes or becomes damaged. The esophagus is the tube that carries food from the throat to the stomach. With Barrett's esophagus, the cells that line the esophagus are replaced by cells that are similar to the lining of the intestines (intestinal metaplasia). Barrett's esophagus itself may not cause any symptoms. However, many people who have Barrett's esophagus also have gastroesophageal reflux disease (GERD), which may cause symptoms such as heartburn. Over time, a few people with this condition may develop cancer of the esophagus. Treatment may include medicines, procedures to destroy the abnormal cells, or surgery. What are the causes? The exact cause of this condition is not known. In some cases, the condition develops from damage to the lining of the esophagus caused by GERD. GERD occurs when stomach acids flow up from the stomach into the esophagus. Frequent symptoms of GERD may cause intestinal metaplasia or cause cell changes (dysplasia). What increases the risk? You are more likely to develop this condition if you:  Have GERD.  Are female.  Are Caucasian.  Are obese.  Are older than 50.  Have a hiatal hernia. This is a condition in which part of your stomach bulges into your chest.  Smoke. What are the signs or symptoms? People with Barrett's esophagus often have no symptoms. However, many people with this condition also have GERD. Symptoms of GERD may include:  Heartburn.  Difficulty swallowing.  Dry cough. How is this diagnosed? This condition may be diagnosed based on:  Results of an upper gastrointestinal endoscopy. For this exam, a thin, flexible tube with a light and a camera on the end (endoscope) is  passed down your esophagus. Your health care provider can view the inside of your esophagus during this procedure.  Results of a biopsy. For this procedure, several tissue samples are removed (biopsy) from your esophagus. They are then checked for intestinal metaplasia or dysplasia. How is this treated? Treatment for this condition may include:  Medicines (proton pump inhibitors, or PPIs) to decrease or stop GERD.  Periodic endoscopic exams to make sure that cancer is not developing.  A procedure or surgery for dysplasia. This may include: ? Removal or destruction of abnormal cells. ? Removal of part of the esophagus (esophagectomy). Follow these instructions at home: Eating and drinking  Eat more fruits and vegetables.  Avoid fatty foods.  Eat small, frequent meals instead of large meals.  Avoid foods that cause heartburn. These foods include: ? Coffee and alcoholic drinks. ? Tomatoes and foods made with tomatoes. ? Greasy or spicy foods. ? Chocolate and peppermint.  Do not drink alcohol. General instructions  Take over-the-counter and prescription medicines only as told by your health care provider.  Do not use any products that contain nicotine or tobacco, such as cigarettes and e-cigarettes. If you need help quitting, ask your health care provider.  If you are being treated for GERD, make sure you take medicines and follow all instructions as told by your health care provider.  Keep all follow-up visits as told by your health care provider. This is important. Contact a health care provider if:  You have heartburn or GERD symptoms.  You have difficulty swallowing. Get help right away if:  You have chest pain.  You are unable  to swallow.  You vomit blood or material that looks like coffee grounds.  Your stool (feces) is bright red or dark. Summary  Barrett's esophagus occurs when the tissue that lines the esophagus changes or becomes damaged.  Barrett's  esophagus may be diagnosed with an upper gastrointestinal endoscopy and a biopsy.  Treatment may include medicines, procedures to remove abnormal cells, or surgery.  Follow your health care provider's instructions about what to eat and drink, what medicines to take, and when to call for help. This information is not intended to replace advice given to you by your health care provider. Make sure you discuss any questions you have with your health care provider. Document Revised: 01/06/2018 Document Reviewed: 01/06/2018 Elsevier Patient Education  2020 [employer].

## 2020-05-26 NOTE — Telephone Encounter (Signed)
Discussed with karen and phillip at [facility] notified.

## 2020-05-26 NOTE — Progress Notes (Signed)
Patient ID: Katie Hernandez, female    DOB: Aug 11, 1976, 44 y.o.   MRN: [id number]   Chief Complaint  Patient presents with  . Gastroesophageal Reflux   Subjective:    HPI  Pt having reflux issues. Pt states she is burping more than usual. Pt also states sometimes she feels that she need to make herself burp. Pt states she has not been taking Protonix because she is unable to swallow them.   Medical History Katie Hernandez has no past medical history on file.   Outpatient Encounter Medications as of 05/26/2020  Medication Sig  . fluticasone (FLONASE) 50 MCG/ACT nasal spray Place 2 sprays into both nostrils daily. Prn head congestion  . loratadine-pseudoephedrine (CLARITIN-D 12-HOUR) 5-120 MG tablet Take 1 tablet by mouth as needed for allergies.  . [DISCONTINUED] pantoprazole (PROTONIX) 40 MG tablet Take 1 tablet (40 mg total) by mouth daily. Prn acid reflux  . amoxicillin-clavulanate (AUGMENTIN ES-600) 600-42.9 MG/5ML suspension Needs 875/125 in suspension.  . [DISCONTINUED] amoxicillin-clavulanate (AUGMENTIN) 875-125 MG tablet One bid for 10 d  . [DISCONTINUED] pantoprazole sodium (PROTONIX) 40 mg/20 mL PACK Take 40 mg daily. Needs liquid due to inability to swallow tablets.   No facility-administered encounter medications on file as of 05/26/2020.     Review of Systems  Constitutional: Negative for chills and fever.  HENT: Positive for sinus pressure, sinus pain and trouble swallowing.        Difficulty swallowing related to taking pills and feeling like things get "stuck"  Eyes: Negative.   Respiratory: Negative.   Cardiovascular: Negative.   Gastrointestinal: Negative.   Musculoskeletal: Negative.   Skin: Negative.   Allergic/Immunologic: Positive for environmental allergies.  Neurological: Negative.   Hematological: Positive for adenopathy.  Psychiatric/Behavioral: Negative.      Vitals BP 122/74   Pulse 87   Temp (!) 97.2 F (36.2 C)   Ht 5' 2.75" (1.594 m)   Wt 107 lb  3.2 oz (48.6 kg)   SpO2 100%   BMI 19.14 kg/m   Objective:   Physical Exam Vitals and nursing note reviewed.  Constitutional:      General: She is not in acute distress.    Appearance: Normal appearance. She is normal weight. She is not ill-appearing.  HENT:     Right Ear: There is impacted cerumen.     Left Ear: Tympanic membrane is erythematous.     Nose:     Left Sinus: Maxillary sinus tenderness present.     Mouth/Throat:     Lips: Pink.     Mouth: Mucous membranes are moist.     Pharynx: Posterior oropharyngeal erythema present. No pharyngeal swelling, oropharyngeal exudate or uvula swelling.     Comments: No problems with swallowing secretions/saliva today in office. No distress. Cardiovascular:     Rate and Rhythm: Normal rate and regular rhythm.     Pulses: Normal pulses.     Heart sounds: Normal heart sounds.  Pulmonary:     Effort: Pulmonary effort is normal.     Breath sounds: Normal breath sounds.  Skin:    General: Skin is warm and dry.  Neurological:     Mental Status: She is alert and oriented to person, place, and time.  Psychiatric:        Mood and Affect: Mood normal.        Behavior: Behavior normal.      Assessment and Plan   1. Acute rhinosinusitis - amoxicillin-clavulanate (AUGMENTIN ES-600) 600-42.9 MG/5ML suspension; Needs 875/125  in suspension.  Dispense: 200 mL; Refill: 0  2. Gastroesophageal reflux disease, unspecified whether esophagitis present - Ambulatory referral to Gastroenterology  3. Seasonal allergies  Consult with pharmacist on recommendation for liquid allergy medicaiton OTC.  Katie Hernandez presents today with sinus pain/tenderness, ongoing reflux (untreated due to inability to swallow tablets), and "feels like something is sticking in throat" at times.   Her left ear is erythematous and her throat is erythematous as well as maxillary facial tenderness and pain.   Consulted with [place] on proper dosing for  Augmentin 875/125 mg susp and changed Protonix to Nexium 40 mg granules due to insurance coverage. Verbal orders given to make adjustments on their side.   With history of untreated GERD and feeling of something sticking in the throat, will refer to GI for further management to prevent future issues.  Agrees with plan of care discussed today. Understands warning signs to seek further care: inability to swallow.  Understands to follow-up if symptoms do not improve or if anything changes, however, she will see GI specialist for further evaluation of GERD.    Novella Olive, NP 05/26/2020

## 2020-07-04 ENCOUNTER — Encounter: Payer: Self-pay | Admitting: Gastroenterology

## 2020-07-04 ENCOUNTER — Ambulatory Visit: Payer: BC Managed Care – PPO | Admitting: Gastroenterology

## 2020-07-04 VITALS — BP 126/80 | HR 77 | Ht 63.0 in | Wt 108.5 lb

## 2020-07-04 DIAGNOSIS — R1310 Dysphagia, unspecified: Secondary | ICD-10-CM | POA: Diagnosis not present

## 2020-07-04 DIAGNOSIS — R142 Eructation: Secondary | ICD-10-CM

## 2020-07-04 NOTE — Patient Instructions (Signed)
If you are age 44 or older, your body mass index should be between 23-30. Your Body mass index is 19.22 kg/m. If this is out of the aforementioned range listed, please consider follow up with your Primary Care Provider.  If you are age 26 or younger, your body mass index should be between 19-25. Your Body mass index is 19.22 kg/m. If this is out of the aformentioned range listed, please consider follow up with your Primary Care Provider.   You have been scheduled for an endoscopy. Please follow written instructions given to you at your visit today. If you use inhalers (even only as needed), please bring them with you on the day of your procedure.  Due to recent changes in healthcare laws, you may see the results of your imaging and laboratory studies on MyChart before your provider has had a chance to review them.  We understand that in some cases there may be results that are confusing or concerning to you. Not all laboratory results come back in the same time frame and the provider may be waiting for multiple results in order to interpret others.  Please give [place] 48 hours in order for your provider to thoroughly review all the results before contacting the office for clarification of your results.

## 2020-07-04 NOTE — Progress Notes (Signed)
     07/04/2020 Katie Hernandez [id number] June 22, 1976   HISTORY OF PRESENT ILLNESS:  This is a pleasant 44 year old female who is new to our office.  She has very limited past medical history.  She is here today at the request of Dorena Bodo, NP, for evaluation regarding issues with presumed GERD.  The patient tells me that over the past few months she started developing sensation that food is getting stuck when she eats.  She says that has particularly worsened over the past month or so.  Never had any issues similar to this in the past.  She has no problems swallowing liquids.  She says that when this happens it feels like there is an air bubble or something in her esophagus.  She reports a lot of belching.  She is on Nexium 40 mg daily currently for about the past month or so.  She is opening that capsule to take the medication.  Previously she had been on pantoprazole.   Past Medical History:  Diagnosis Date  . GERD (gastroesophageal reflux disease)    History reviewed. No pertinent surgical history.  reports that she has never smoked. She has never used smokeless tobacco. She reports that she does not drink alcohol and does not use drugs. family history is not on file. No Known Allergies    Outpatient Encounter Medications as of 07/04/2020  Medication Sig  . desloratadine (CLARINEX) 0.5 MG/ML syrup Take 5 mg by mouth as needed.  Marland Kitchen esomeprazole (NEXIUM) 40 MG capsule Take 40 mg by mouth daily. Opens capsule to swallow.  . fluticasone (FLONASE) 50 MCG/ACT nasal spray Place 2 sprays into both nostrils daily. Prn head congestion  . [DISCONTINUED] loratadine-pseudoephedrine (CLARITIN-D 12-HOUR) 5-120 MG tablet Take 1 tablet by mouth as needed for allergies.  . [DISCONTINUED] amoxicillin-clavulanate (AUGMENTIN ES-600) 600-42.9 MG/5ML suspension Needs 875/125 in suspension. (Patient not taking: Reported on 07/04/2020)   No facility-administered encounter medications on file as of 07/04/2020.      REVIEW OF SYSTEMS  : All other systems reviewed and negative except where noted in the History of Present Illness.  PHYSICAL EXAM: BP 126/80   Pulse 77   Ht 5\' 3"  (1.6 m)   Wt 108 lb 8 oz (49.2 kg)   SpO2 97%   BMI 19.22 kg/m  General: Well developed white female in no acute distress Head: Normocephalic and atraumatic Eyes:  Sclerae anicteric, conjunctiva pink. Ears: Normal auditory acuity Lungs: Clear throughout to auscultation; no W/R/R. Heart: Regular rate and rhythm; no M/R/G. Abdomen: Soft, non-distended.  BS present.  Non-tender. Rectal:  Will be done at the time of colonoscopy. Musculoskeletal: Symmetrical with no gross deformities  Skin: No lesions on visible extremities Extremities: No edema  Neurological: Alert oriented x 4, grossly non-focal Psychological:  Alert and cooperative. Normal mood and affect  ASSESSMENT AND PLAN: *Dysphagia and belching:  Symptoms new over the past few months and worsening over the past month or so.  Dysphagia is to solid food only.  Will plan for EGD with possible dilation with Dr. to rule out stricture, Schatzki's ring, esophagitis, EoE, etc.  She has been on PPI therapy, currently in the form of Nexium 40 mg daily.  We will continue that for now.  The risks, benefits, and alternatives to EGD with possible dilation were discussed with the patient and she consents to proceed.   CC:  Orvan Falconer, NP

## 2020-07-04 NOTE — Progress Notes (Signed)
Reviewed and agree with management plans. ? ?Harbor Paster L. Clodagh Odenthal, MD, MPH  ?

## 2020-07-05 ENCOUNTER — Encounter: Payer: Self-pay | Admitting: Gastroenterology

## 2020-07-05 ENCOUNTER — Other Ambulatory Visit: Payer: Self-pay

## 2020-07-05 ENCOUNTER — Ambulatory Visit (AMBULATORY_SURGERY_CENTER): Payer: BC Managed Care – PPO | Admitting: Gastroenterology

## 2020-07-05 ENCOUNTER — Other Ambulatory Visit: Payer: Self-pay | Admitting: *Deleted

## 2020-07-05 VITALS — BP 105/60 | HR 79 | Temp 97.1°F | Resp 11 | Ht 63.0 in | Wt 108.0 lb

## 2020-07-05 DIAGNOSIS — K200 Eosinophilic esophagitis: Secondary | ICD-10-CM | POA: Diagnosis not present

## 2020-07-05 DIAGNOSIS — R142 Eructation: Secondary | ICD-10-CM

## 2020-07-05 DIAGNOSIS — R1310 Dysphagia, unspecified: Secondary | ICD-10-CM | POA: Diagnosis not present

## 2020-07-05 DIAGNOSIS — K2970 Gastritis, unspecified, without bleeding: Secondary | ICD-10-CM | POA: Diagnosis not present

## 2020-07-05 DIAGNOSIS — K2289 Other specified disease of esophagus: Secondary | ICD-10-CM

## 2020-07-05 MED ORDER — SODIUM CHLORIDE 0.9 % IV SOLN: 500.0000 mL | Freq: Once | INTRAVENOUS | Status: DC

## 2020-07-05 MED ORDER — ESOMEPRAZOLE MAGNESIUM 40 MG PO CPDR: 40.0000 mg | DELAYED_RELEASE_CAPSULE | Freq: Two times a day (BID) | ORAL | 3 refills | Status: DC

## 2020-07-05 NOTE — Progress Notes (Signed)
Called to room to assist during endoscopic procedure.  Patient ID and intended procedure confirmed with present staff. Received instructions for my participation in the procedure from the performing physician.  

## 2020-07-05 NOTE — Progress Notes (Signed)
Report to PACU, RN, vss, BBS= Clear.  

## 2020-07-05 NOTE — Op Note (Signed)
[facility] Patient Name: Katie Hernandez Procedure Date: 07/05/2020 8:00 AM MRN: [id number] Endoscopist: Tressia Danas MD, MD Age: 44 Referring MD:  Date of Birth: 1976/06/15 Gender: Female Account #: [id number] Procedure:                Upper GI endoscopy Indications:              Dysphagia, Eructation Medicines:                Monitored Anesthesia Care Procedure:                Pre-Anesthesia Assessment:                           - Prior to the procedure, a History and Physical                            was performed, and patient medications and                            allergies were reviewed. The patient's tolerance of                            previous anesthesia was also reviewed. The risks                            and benefits of the procedure and the sedation                            options and risks were discussed with the patient.                            All questions were answered, and informed consent                            was obtained. Prior Anticoagulants: The patient has                            taken no previous anticoagulant or antiplatelet                            agents. ASA Grade Assessment: II - A patient with                            mild systemic disease. After reviewing the risks                            and benefits, the patient was deemed in                            satisfactory condition to undergo the procedure.                           After obtaining informed consent, the endoscope was  passed under direct vision. Throughout the                            procedure, the patient's blood pressure, pulse, and                            oxygen saturations were monitored continuously. The                            Endoscope was introduced through the mouth, and                            advanced to the third part of duodenum. The upper                            GI endoscopy was accomplished  without difficulty.                            The patient tolerated the procedure well. Scope In: Scope Out: Findings:                 The examined esophagus was normal. No ring, web,                            stricture, or esophagitis. A TTS dilator was passed                            through the scope. Dilation with a 16-17-18 mm                            balloon dilator was performed to 18 mm. There was                            no resistance to the fully inflated balloon. The                            dilation site was examined following endoscope                            reinsertion and showed no change. Biopsies were                            taken from the prox/mid and distal esophagus with a                            cold forceps for histology. Estimated blood loss                            was minimal.                           Localized mild inflammation characterized by  erythema, friability and granularity was found in                            the gastric body. Biopsies were taken from the                            antrum, body, and fundus with a cold forceps for                            histology. Estimated blood loss was minimal.                           The examined duodenum was normal. Biopsies were                            taken with a cold forceps for histology. Estimated                            blood loss was minimal. Complications:            No immediate complications. Estimated blood loss:                            Minimal. Estimated Blood Loss:     Estimated blood loss was minimal. Impression:               - Normal esophagus. Dilated. Biopsied.                           - Gastritis. Biopsied.                           - Normal examined duodenum. Biopsied. Recommendation:           - Patient has a contact number available for                            emergencies. The signs and symptoms of potential                             delayed complications were discussed with the                            patient. Return to normal activities tomorrow.                            Written discharge instructions were provided to the                            patient.                           - Resume previous diet.                           - Continue present medications. Increase Nexium to  40 mg BID.                           - Await pathology results.                           - No aspirin, ibuprofen, naproxen, or other                            non-steroidal anti-inflammatory drugs.                           - Follow-up with Dr. Orvan FalconerBe[facility] of [facility] in 4-6                            weeks, earlier if needed. Tressia DanasKimberly Lashanti Chambless MD, MD 07/05/2020 8:20:41 AM This report has been signed electronically.

## 2020-07-05 NOTE — Patient Instructions (Addendum)
Handouts Provided:  Post Dilation Diet  Office will call you to schedule office follow up.   YOU HAD AN ENDOSCOPIC PROCEDURE TODAY AT THE [facility]:   Refer to the procedure report that was given to you for any specific questions about what was found during the examination.  If the procedure report does not answer your questions, please call your gastroenterologist to clarify.  If you requested that your care partner not be given the details of your procedure findings, then the procedure report has been included in a sealed envelope for you to review at your convenience later.  YOU SHOULD EXPECT: Some feelings of bloating in the abdomen. Passage of more gas than usual.  Walking can help get rid of the air that was put into your GI tract during the procedure and reduce the bloating. If you had a lower endoscopy (such as a colonoscopy or flexible sigmoidoscopy) you may notice spotting of blood in your stool or on the toilet paper. If you underwent a bowel prep for your procedure, you may not have a normal bowel movement for a few days.  Please Note:  You might notice some irritation and congestion in your nose or some drainage.  This is from the oxygen used during your procedure.  There is no need for concern and it should clear up in a day or so.  SYMPTOMS TO REPORT IMMEDIATELY:   Following upper endoscopy (EGD)  Vomiting of blood or coffee ground material  New chest pain or pain under the shoulder blades  Painful or persistently difficult swallowing  New shortness of breath  Fever of 100F or higher  Black, tarry-looking stools  For urgent or emergent issues, a gastroenterologist can be reached at any hour by calling [phone]. Do not use MyChart messaging for urgent concerns.    DIET:  We do recommend a small meal at first, but then you may proceed to your regular diet.  Drink plenty of fluids but you should avoid alcoholic beverages for 24 hours.  ACTIVITY:  You  should plan to take it easy for the rest of today and you should NOT DRIVE or use heavy machinery until tomorrow (because of the sedation medicines used during the test).    FOLLOW UP: Our staff will call the number listed on your records 48-72 hours following your procedure to check on you and address any questions or concerns that you may have regarding the information given to you following your procedure. If we do not reach you, we will leave a message.  We will attempt to reach you two times.  During this call, we will ask if you have developed any symptoms of COVID 19. If you develop any symptoms (ie: fever, flu-like symptoms, shortness of breath, cough etc.) before then, please call [phone].  If you test positive for Covid 19 in the 2 weeks post procedure, please call and report this information to [place].    If any biopsies were taken you will be contacted by phone or by letter within the next 1-3 weeks.  Please call us at [phone] if you have not heard about the biopsies in 3 weeks.    SIGNATURES/CONFIDENTIALITY: You and/or your care partner have signed paperwork which will be entered into your electronic medical record.  These signatures attest to the fact that that the information above on your After Visit Summary has been reviewed and is understood.  Full responsibility of the confidentiality of this discharge information lies with  you and/or your care-partner.

## 2020-07-05 NOTE — Progress Notes (Signed)
VS-CW 

## 2020-07-07 ENCOUNTER — Telehealth: Payer: Self-pay | Admitting: Gastroenterology

## 2020-07-07 ENCOUNTER — Telehealth: Payer: Self-pay | Admitting: *Deleted

## 2020-07-07 NOTE — Telephone Encounter (Signed)
Pt called asking what she could take for a headache. Instructed the patient that she could take tylenol. PT had questions about nausea with vomiting x 1 post procedure but she is post op day 2 and feeling fine today. Questions answered.

## 2020-07-07 NOTE — Telephone Encounter (Signed)
Patient called asking in the event she got a headache what can she take? Since the instructions indicate not to take any aspirin etc.

## 2020-07-07 NOTE — Telephone Encounter (Signed)
°  Follow up Call-  Call back number 07/05/2020  Post procedure Call Back phone  # [phone]  Permission to leave phone message Yes  Some recent data might be hidden     Patient questions:  Do you have a fever, pain , or abdominal swelling? No. Pain Score  0   Have you tolerated food without any problems? Yes.    Have you been able to return to your normal activities? Yes.    Do you have any questions about your discharge instructions: Diet   No. Medications  No. Follow up visit  No.  Do you have questions or concerns about your Care? No.  Actions: * If pain score is 4 or above: No action needed, pain <4  1. Have you developed a fever since your procedure? NO  2.   Have you had an respiratory symptoms (SOB or cough) since your procedure? NO  3.   Have you tested positive for COVID 19 since your procedure NO  4.   Have you had any family members/close contacts diagnosed with the COVID 19 since your procedure?  NO   If yes to any of these questions please route to Laverna Peace, RN and Karlton Lemon, RN

## 2020-07-10 ENCOUNTER — Encounter: Payer: Self-pay | Admitting: Gastroenterology

## 2020-08-29 ENCOUNTER — Ambulatory Visit: Payer: BC Managed Care – PPO | Admitting: Gastroenterology

## 2020-08-29 ENCOUNTER — Encounter: Payer: Self-pay | Admitting: Gastroenterology

## 2020-08-29 VITALS — BP 96/70 | HR 84 | Ht 62.6 in | Wt 102.1 lb

## 2020-08-29 DIAGNOSIS — R1310 Dysphagia, unspecified: Secondary | ICD-10-CM | POA: Diagnosis not present

## 2020-08-29 DIAGNOSIS — K219 Gastro-esophageal reflux disease without esophagitis: Secondary | ICD-10-CM

## 2020-08-29 DIAGNOSIS — R142 Eructation: Secondary | ICD-10-CM

## 2020-08-29 MED ORDER — ESOMEPRAZOLE MAGNESIUM 40 MG PO CPDR: 40.0000 mg | DELAYED_RELEASE_CAPSULE | Freq: Two times a day (BID) | ORAL | 3 refills | Status: DC

## 2020-08-29 NOTE — Progress Notes (Addendum)
Referring Provider: Annalee Genta, DO Primary Care Physician:  Annalee Genta, DO  Chief complaint:  Reflux and belchin   IMPRESSION:  Biopsy-proven reflux on EGD    - biopsies negative for eosinophilic esophagitis Dysphagia - ? GERD-related dysmotility    - no change after empiric 16-50mm TTS balloon dilation Eructation  Recent symptoms that may all be related to biopsy-proven reflux including dysphagia and increased eructation. Some improvement since increasing PPI to BID therapy.   Diaphragmatic breathing recommended for breakthrough symptoms.   Reviewed GERD lifestyle modifications. Discussed avoiding gum chewing, smoking, drinking carbonated beverages, and gulping foods and liquids while eating.   If symptoms persistent, will proceed with 24 ph probe and impedence testing.  Trial of baclofen 10 mg TID to reduce transient LES relaxations.   Consider cross-sectional imaging with a CT scan with any alarm features.      PLAN: Continue esomperazole 40 mg BID (3 months with 3 refills today) Reviewed lifestyle modifications Discussed diaphragmatic breathing Proceed with 24 pH probe and impedence testing if symptoms persist Start colon cancer screening at age 52  Please see the "Patient Instructions" section for addition details about the plan.  HPI: TEMPERANCE KELEMEN is a 44 y.o. female who returns in follow-up after endoscopic evaluation.  She was initially seen in consultation by Doug Sou 07/04/2020 for solid food dysphagia and eructation. Previously on pantoprazole but she had difficulty swallowing the pills and more recently  esmeprazole.  EGD 07/05/2020 revealed a normal esophagus.  Distal esophageal biopsies were consistent with reflux.  Empiric dilation performed with a 16 to 18 mm TTS balloon.  Esophageal biopsies were negative for eosinophilic esophagitis.  Mild gastric inflammation endoscopically but gastric biopsies were normal.  There was no H. pylori.  Duodenal  biopsies were normal.  Returns in scheduled follow-up. Reports 100% adherence with the Nexium.  Dysphagia has improved although she attributes this to eating smaller bites, chewing well, discontinuing coffee. She did not find the dilation helpful.  She continues to have increased eructation but it has overall decreased on twice daily PPI therapy.   She denies weight loss, abdominal pain, or regurgitation are an indication for further diagnostic evaluation with upper endoscopy and/or imaging (eg, abdominal computed tomography (CT) scan).   No known family history of colon cancer or polyps. No family history of uterine/endometrial cancer, pancreatic cancer or gastric/stomach cancer.   Past Medical History:  Diagnosis Date  . Allergy   . GERD (gastroesophageal reflux disease)     Past Surgical History:  Procedure Laterality Date  . UPPER GASTROINTESTINAL ENDOSCOPY    . WISDOM TOOTH EXTRACTION      Current Outpatient Medications  Medication Sig Dispense Refill  . desloratadine (CLARINEX) 0.5 MG/ML syrup Take 5 mg by mouth as needed.    Marland Kitchen esomeprazole (NEXIUM) 40 MG capsule Take 1 capsule (40 mg total) by mouth 2 (two) times daily. Opens capsule to swallow. 60 capsule 3  . fluticasone (FLONASE) 50 MCG/ACT nasal spray Place 2 sprays into both nostrils daily. Prn head congestion 16 g 6   No current facility-administered medications for this visit.    Allergies as of 08/29/2020  . (No Known Allergies)    Family History  Problem Relation Age of Onset  . Colon cancer Neg Hx   . Esophageal cancer Neg Hx   . Rectal cancer Neg Hx   . Stomach cancer Neg Hx     Social History   Socioeconomic History  . Marital status:  Unknown    Spouse name: Not on file  . Number of children: Not on file  . Years of education: Not on file  . Highest education level: Not on file  Occupational History  . Occupation: stay at home mom  Tobacco Use  . Smoking status: Never Smoker  . Smokeless  tobacco: Never Used  Vaping Use  . Vaping Use: Never used  Substance and Sexual Activity  . Alcohol use: No  . Drug use: No  . Sexual activity: Yes    Birth control/protection: None  Other Topics Concern  . Not on file  Social History Narrative  . Not on file   Social Determinants of Health   Financial Resource Strain:   . Difficulty of Paying Living Expenses: Not on file  Food Insecurity:   . Worried About Programme researcher, broadcasting/film/video in the Last Year: Not on file  . Ran Out of Food in the Last Year: Not on file  Transportation Needs:   . Lack of Transportation (Medical): Not on file  . Lack of Transportation (Non-Medical): Not on file  Physical Activity:   . Days of Exercise per Week: Not on file  . Minutes of Exercise per Session: Not on file  Stress:   . Feeling of Stress : Not on file  Social Connections:   . Frequency of Communication with Friends and Family: Not on file  . Frequency of Social Gatherings with Friends and Family: Not on file  . Attends Religious Services: Not on file  . Active Member of Clubs or Organizations: Not on file  . Attends Banker Meetings: Not on file  . Marital Status: Not on file  Intimate Partner Violence:   . Fear of Current or Ex-Partner: Not on file  . Emotionally Abused: Not on file  . Physically Abused: Not on file  . Sexually Abused: Not on file    Review of Systems: 12 system ROS is negative except as noted above with the addition of allergies, headaches, and menstrual pain.   Physical Exam: General:   Alert,  well-nourished, pleasant and cooperative in NAD Head:  Normocephalic and atraumatic. Eyes:  Sclera clear, no icterus.   Conjunctiva pink. Abdomen:  Soft, nontender, nondistended, normal bowel sounds, no rebound or guarding. No hepatosplenomegaly.   Rectal:  Deferred  Msk:  Symmetrical. No boney deformities LAD: No inguinal or umbilical LAD Extremities:  No clubbing or edema. Neurologic:  Alert and  oriented x4;   grossly nonfocal Skin:  Intact without significant lesions or rashes. Psych:  Alert and cooperative. Normal mood and affect.    Jamel Dunton L. Orvan Falconer, MD, MPH 08/29/2020, 8:37 AM

## 2020-08-29 NOTE — Patient Instructions (Addendum)
I am hopeful that your swallowing and burping will continue to improve on the twice daily esomeprazole.   Continue to avoid all NSAIDs including ibuprofen.   I gave you a brochure about reflux today.   Belly breathing can help improve your belching. Sometimes this is called abdominal breathing or diaphragmatic breathing.     - When you chest breathe all day, you end up inhaling large quantities of air when you are resting or even sleeping.  This can lead to excess gas and bloating.     - Sit upright in a chair. Your spine should be straight, knees bent, shoulders loose, and head and neck relaxed.    - You mouth can be slightly open with teeth separated.     - Place on hand on your chest and one hand on your abdomen, just below the rib cage.    - Breathe in for 4 second through your nose and fee your stomach pushing out against your hand. Try to keep the hand on your chest from moving.     - Hold your stomach muscles tight and then start to let your belly deflate as you exhale for 6 seconds. It helps to purse your lips on the exhale like you are blowing through a straw.   Please call me if your symptoms are not improving as we would want to proceed with further evaluation of your esophagus.  On an aside, I recommend that you start colon cancer screening at age 42. You should plan to have a colonoscopy next year.  PRESCRIPTION MEDICATION(S): We have sent the following medication(s) to your pharmacy:  . Nexium - please take 40mg  by mouth twice daily  If you are age 25 or younger, your body mass index should be between 19-25. Your Body mass index is 18.32 kg/m. If this is out of the aformentioned range listed, please consider follow up with your Primary Care Provider.   Thank you for trusting me with your gastrointestinal care!    10-27-1969, MD, MPH

## 2021-03-15 ENCOUNTER — Other Ambulatory Visit (HOSPITAL_COMMUNITY): Payer: Self-pay | Admitting: Family Medicine

## 2021-03-15 ENCOUNTER — Telehealth: Payer: Self-pay | Admitting: Family Medicine

## 2021-03-15 DIAGNOSIS — Z13220 Encounter for screening for lipoid disorders: Secondary | ICD-10-CM

## 2021-03-15 DIAGNOSIS — Z131 Encounter for screening for diabetes mellitus: Secondary | ICD-10-CM

## 2021-03-15 DIAGNOSIS — Z130 Encounter for screening for diseases of the blood and blood-forming organs and certain disorders involving the immune mechanism: Secondary | ICD-10-CM

## 2021-03-15 DIAGNOSIS — Z1231 Encounter for screening mammogram for malignant neoplasm of breast: Secondary | ICD-10-CM

## 2021-03-15 NOTE — Telephone Encounter (Signed)
Patient needing labs for physical on 7/12

## 2021-03-20 NOTE — Telephone Encounter (Signed)
Blood work ordered in Epic. Patient notified. 

## 2021-03-22 ENCOUNTER — Other Ambulatory Visit: Payer: Self-pay

## 2021-03-22 ENCOUNTER — Ambulatory Visit (HOSPITAL_COMMUNITY)
Admission: RE | Admit: 2021-03-22 | Discharge: 2021-03-22 | Disposition: A | Discharge status: Home / Self Care | Payer: BC Managed Care – PPO | Source: Ambulatory Visit | Attending: Family Medicine | Admitting: Family Medicine

## 2021-03-22 DIAGNOSIS — Z1231 Encounter for screening mammogram for malignant neoplasm of breast: Secondary | ICD-10-CM | POA: Diagnosis present

## 2021-03-29 LAB — CBC WITH DIFFERENTIAL/PLATELET
Basophils Absolute: 0.1 10*3/uL (ref 0.0–0.2)
Basos: 1 %
EOS (ABSOLUTE): 0.1 10*3/uL (ref 0.0–0.4)
Eos: 1 %
Hematocrit: 39 % (ref 34.0–46.6)
Hemoglobin: 12.5 g/dL (ref 11.1–15.9)
Immature Grans (Abs): 0 10*3/uL (ref 0.0–0.1)
Immature Granulocytes: 0 %
Lymphocytes Absolute: 2.2 10*3/uL (ref 0.7–3.1)
Lymphs: 35 %
MCH: 27.7 pg (ref 26.6–33.0)
MCHC: 32.1 g/dL (ref 31.5–35.7)
MCV: 86 fL (ref 79–97)
Monocytes Absolute: 0.4 10*3/uL (ref 0.1–0.9)
Monocytes: 7 %
Neutrophils Absolute: 3.5 10*3/uL (ref 1.4–7.0)
Neutrophils: 56 %
Platelets: 385 10*3/uL (ref 150–450)
RBC: 4.52 x10E6/uL (ref 3.77–5.28)
RDW: 12.5 % (ref 11.7–15.4)
WBC: 6.3 10*3/uL (ref 3.4–10.8)

## 2021-03-29 LAB — COMPREHENSIVE METABOLIC PANEL
ALT: 14 IU/L (ref 0–32)
AST: 15 IU/L (ref 0–40)
Albumin/Globulin Ratio: 1.8 (ref 1.2–2.2)
Albumin: 4.4 g/dL (ref 3.8–4.8)
Alkaline Phosphatase: 73 IU/L (ref 44–121)
BUN/Creatinine Ratio: 11 (ref 9–23)
BUN: 8 mg/dL (ref 6–24)
Bilirubin Total: 0.4 mg/dL (ref 0.0–1.2)
CO2: 24 mmol/L (ref 20–29)
Calcium: 9.4 mg/dL (ref 8.7–10.2)
Chloride: 103 mmol/L (ref 96–106)
Creatinine, Ser: 0.76 mg/dL (ref 0.57–1.00)
Globulin, Total: 2.5 g/dL (ref 1.5–4.5)
Glucose: 98 mg/dL (ref 65–99)
Potassium: 4.5 mmol/L (ref 3.5–5.2)
Sodium: 140 mmol/L (ref 134–144)
Total Protein: 6.9 g/dL (ref 6.0–8.5)
eGFR: 98 mL/min/{1.73_m2} (ref >59)

## 2021-03-29 LAB — LIPID PANEL
Chol/HDL Ratio: 4.1 ratio (ref 0.0–4.4)
Cholesterol, Total: 197 mg/dL (ref 100–199)
HDL: 48 mg/dL (ref >39)
LDL Chol Calc (NIH): 133 mg/dL — ABNORMAL HIGH (ref 0–99)
Triglycerides: 90 mg/dL (ref 0–149)
VLDL Cholesterol Cal: 16 mg/dL (ref 5–40)

## 2021-04-03 ENCOUNTER — Other Ambulatory Visit: Payer: Self-pay

## 2021-04-03 ENCOUNTER — Encounter: Payer: Self-pay | Admitting: Family Medicine

## 2021-04-03 ENCOUNTER — Ambulatory Visit (INDEPENDENT_AMBULATORY_CARE_PROVIDER_SITE_OTHER): Payer: BC Managed Care – PPO | Admitting: Family Medicine

## 2021-04-03 VITALS — BP 116/74 | HR 86 | Temp 98.1°F | Ht 62.0 in | Wt 103.2 lb

## 2021-04-03 DIAGNOSIS — Z01419 Encounter for gynecological examination (general) (routine) without abnormal findings: Secondary | ICD-10-CM

## 2021-04-03 DIAGNOSIS — Z01411 Encounter for gynecological examination (general) (routine) with abnormal findings: Secondary | ICD-10-CM

## 2021-04-03 DIAGNOSIS — K219 Gastro-esophageal reflux disease without esophagitis: Secondary | ICD-10-CM | POA: Diagnosis not present

## 2021-04-03 MED ORDER — ESOMEPRAZOLE MAGNESIUM 40 MG PO CPDR: 40.0000 mg | DELAYED_RELEASE_CAPSULE | Freq: Two times a day (BID) | ORAL | 5 refills | Status: AC

## 2021-04-03 NOTE — Progress Notes (Signed)
Patient ID: Katie Hernandez, female    DOB: 17-May-1976, 45 y.o.   MRN: [id number]   Chief Complaint  Patient presents with   Annual Exam   Subjective:    HPI The patient comes in today for a wellness visit.    A review of their health history was completed.  A review of medications was also completed.  Any needed refills; Nexium  Eating habits: healthy  Falls/  MVA accidents in past few months: no  Regular exercise:need to exercise more  Specialist pt sees on regular basis:GI-not regular   Preventative health issues were discussed.   Additional concerns:   Slight elevated LDL.  Pt going to decrease oils and eggs in diet. Pt is vegetarian.  Slight inc in cholesterol.  Has been using of more oil in cooking.  Vegetarian. Eating a lot of eggs.  Daily eggs. Going bm regularly, no blood or black stools.  No family history of colon cancer. Mammo- normal 03/23/21 LMP- 03/20/21.  Medical History Katie Hernandez has a past medical history of Allergy and GERD (gastroesophageal reflux disease).   Outpatient Encounter Medications as of 04/03/2021  Medication Sig   desloratadine (CLARINEX) 0.5 MG/ML syrup Take 5 mg by mouth as needed.   fluticasone (FLONASE) 50 MCG/ACT nasal spray Place 2 sprays into both nostrils daily. Prn head congestion   [DISCONTINUED] esomeprazole (NEXIUM) 40 MG capsule Take 1 capsule (40 mg total) by mouth 2 (two) times daily. Opens capsule to swallow.   esomeprazole (NEXIUM) 40 MG capsule Take 1 capsule (40 mg total) by mouth 2 (two) times daily. Opens capsule to swallow.   No facility-administered encounter medications on file as of 04/03/2021.     Review of Systems  Constitutional:  Negative for chills and fever.  HENT:  Negative for congestion, rhinorrhea and sore throat.   Respiratory:  Negative for cough, shortness of breath and wheezing.   Cardiovascular:  Negative for chest pain and leg swelling.  Gastrointestinal:  Negative for abdominal pain,  diarrhea, nausea and vomiting.  Genitourinary:  Negative for dysuria and frequency.  Musculoskeletal:  Negative for arthralgias and back pain.  Skin:  Negative for rash.  Neurological:  Negative for dizziness, weakness and headaches.    Vitals BP 116/74   Pulse 86   Temp 98.1 F (36.7 C)   Ht 5\' 2"  (1.575 m)   Wt 103 lb 3.2 oz (46.8 kg)   LMP 03/22/2021   SpO2 99%   BMI 18.88 kg/m   Objective:   Physical Exam Vitals and nursing note reviewed. Exam conducted with a chaperone present.  Constitutional:      General: She is not in acute distress.    Appearance: Normal appearance. She is not ill-appearing.  HENT:     Head: Normocephalic and atraumatic.     Right Ear: Tympanic membrane, ear canal and external ear normal.     Left Ear: Tympanic membrane, ear canal and external ear normal.     Nose: Nose normal.     Mouth/Throat:     Mouth: Mucous membranes are moist.     Pharynx: Oropharynx is clear.  Eyes:     Extraocular Movements: Extraocular movements intact.     Conjunctiva/sclera: Conjunctivae normal.     Pupils: Pupils are equal, round, and reactive to light.  Cardiovascular:     Rate and Rhythm: Normal rate and regular rhythm.     Pulses: Normal pulses.     Heart sounds: Normal heart sounds.  Pulmonary:  Effort: Pulmonary effort is normal.     Breath sounds: Normal breath sounds. No wheezing, rhonchi or rales.  Chest:     Chest wall: No mass.  Breasts:    Right: Normal. No swelling, bleeding, inverted nipple, mass, nipple discharge, skin change, tenderness, axillary adenopathy or supraclavicular adenopathy.     Left: Normal. No swelling, bleeding, inverted nipple, mass, nipple discharge, skin change, tenderness, axillary adenopathy or supraclavicular adenopathy.  Abdominal:     General: Abdomen is flat. Bowel sounds are normal. There is no distension.     Palpations: Abdomen is soft. There is no mass.     Tenderness: There is no abdominal tenderness. There is  no guarding or rebound.     Hernia: No hernia is present.  Genitourinary:    General: Normal vulva.     Vagina: No vaginal discharge.  Musculoskeletal:        General: Normal range of motion.     Cervical back: Normal range of motion.     Right lower leg: No edema.     Left lower leg: No edema.  Lymphadenopathy:     Upper Body:     Right upper body: No supraclavicular, axillary or pectoral adenopathy.     Left upper body: No supraclavicular, axillary or pectoral adenopathy.  Skin:    General: Skin is warm and dry.     Findings: No lesion or rash.  Neurological:     General: No focal deficit present.     Mental Status: She is alert and oriented to person, place, and time.     Cranial Nerves: No cranial nerve deficit.  Psychiatric:        Mood and Affect: Mood normal.        Behavior: Behavior normal.        Thought Content: Thought content normal.        Judgment: Judgment normal.     Assessment and Plan   1. Well woman exam with routine gynecological exam  2. Gastroesophageal reflux disease without esophagitis - esomeprazole (NEXIUM) 40 MG capsule; Take 1 capsule (40 mg total) by mouth 2 (two) times daily. Opens capsule to swallow.  Dispense: 60 capsule; Refill: 5   HM- reviewed. Due in 2023- for pap. HLD- dec cholesterol in diet.  Gerd- stable. Cont meds.  Offered referral for colonoscopy, pt wanting to wait.  Return in about 1 year (around 04/03/2022) for well woman.

## 2021-04-06 ENCOUNTER — Telehealth: Payer: Self-pay | Admitting: Family Medicine

## 2021-04-06 NOTE — Telephone Encounter (Signed)
Pt brought by paperwork to be filled out by provider. Placed in providers folder at front desk

## 2021-04-07 NOTE — Telephone Encounter (Signed)
Paperwork filled out and placed up front for pick up. Left message to return call

## 2021-04-10 NOTE — Telephone Encounter (Signed)
Patient notified

## 2022-03-17 IMAGING — MG MM DIGITAL SCREENING BILAT W/ TOMO AND CAD
6 of 12 series · 6 of 36 positions shown · non-contrast
Comparison: Previous exam(s).

CLINICAL DATA: Screening.

EXAM:
DIGITAL SCREENING BILATERAL MAMMOGRAM WITH TOMOSYNTHESIS AND CAD
TECHNIQUE: Bilateral screening digital craniocaudal and mediolateral oblique
mammograms were obtained. Bilateral screening digital breast
tomosynthesis was performed. The images were evaluated with
computer-aided detection.

[R CC synth-2D (1 of 2)]
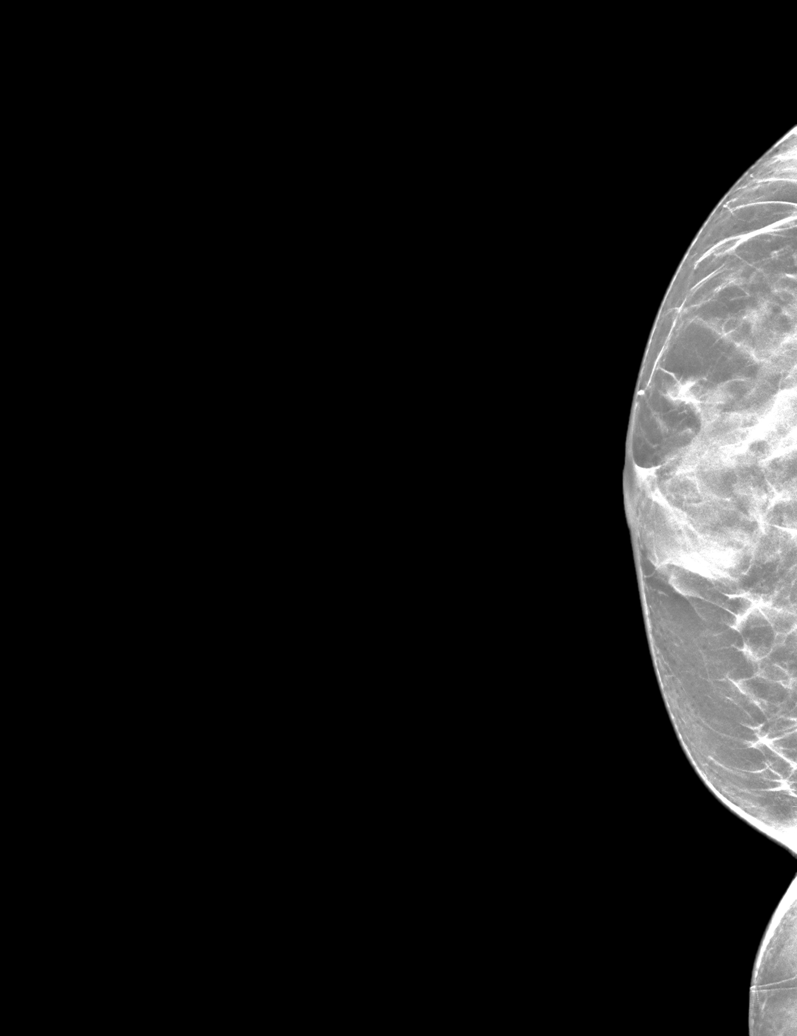

[L CC synth-2D (1 of 2)]
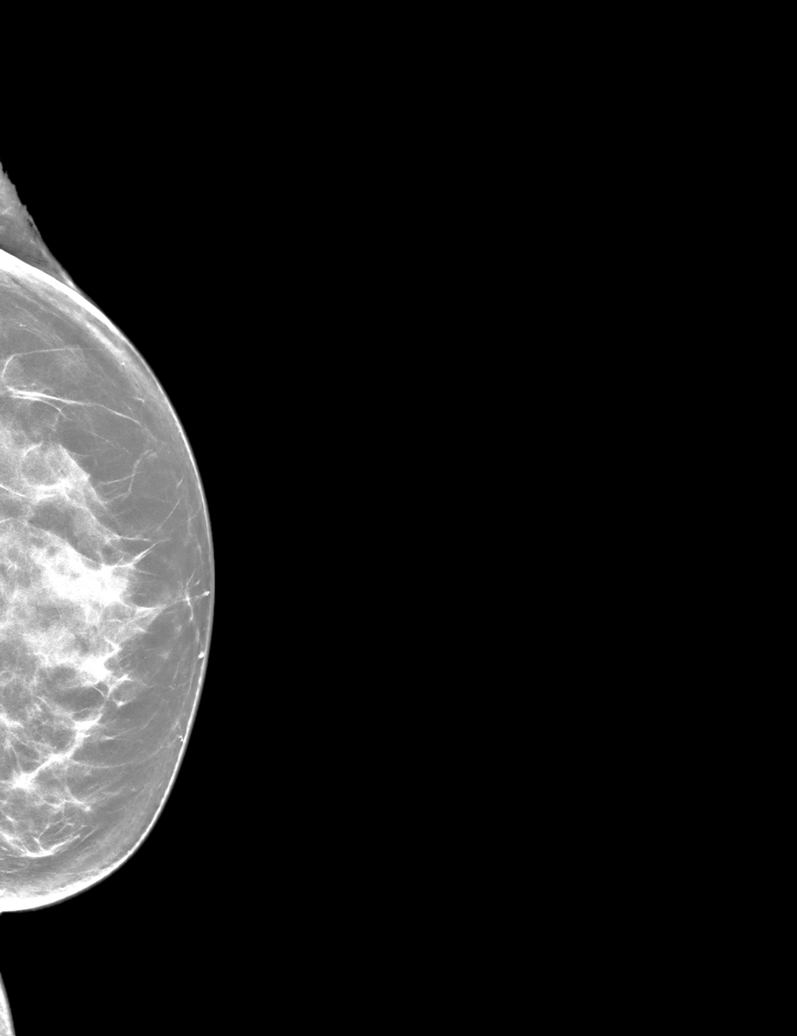

[R CC synth-2D (2 of 2)]
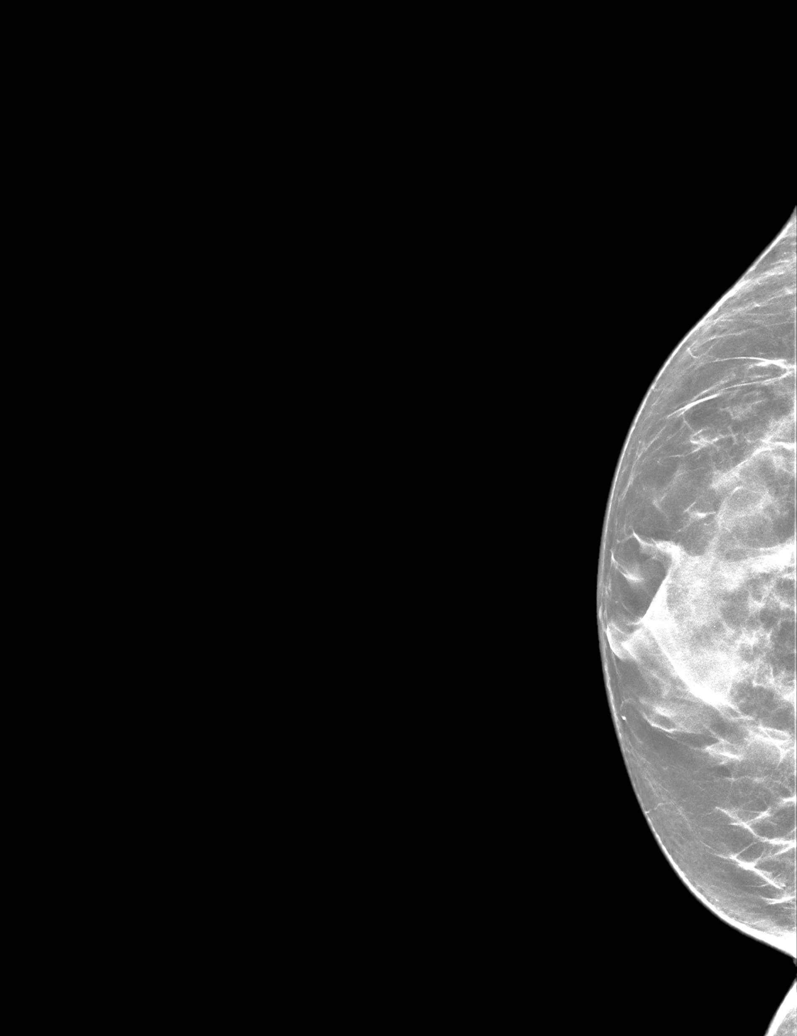

[R MLO synth-2D]
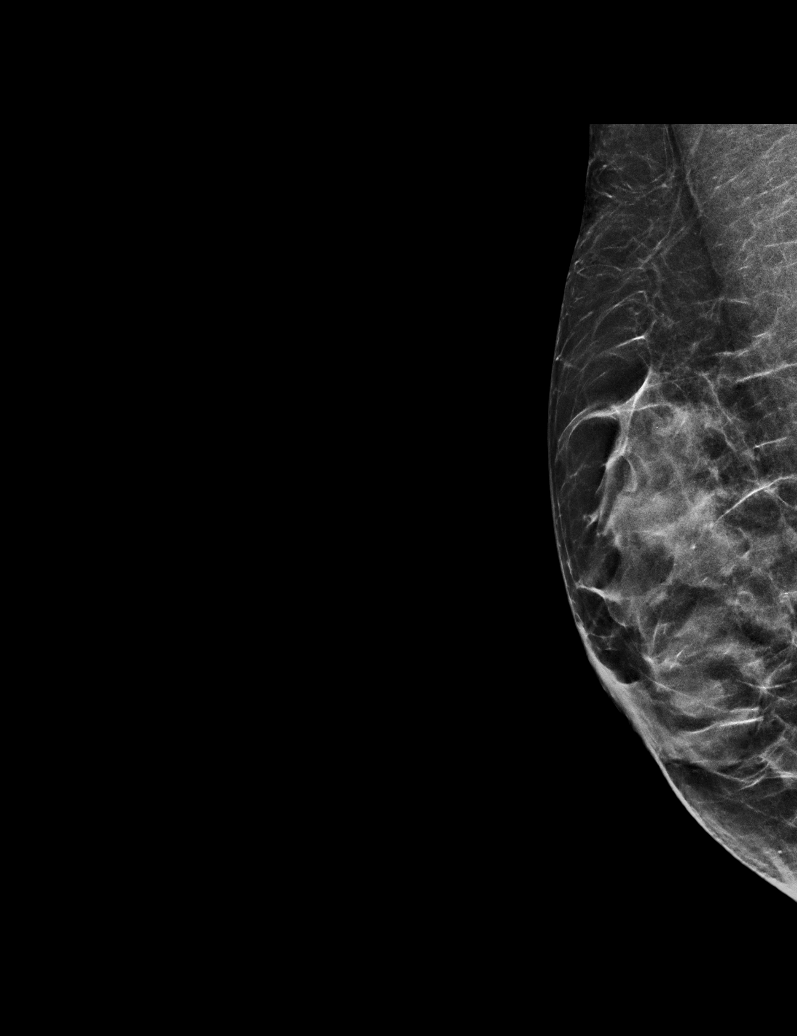

[L CC synth-2D (2 of 2)]
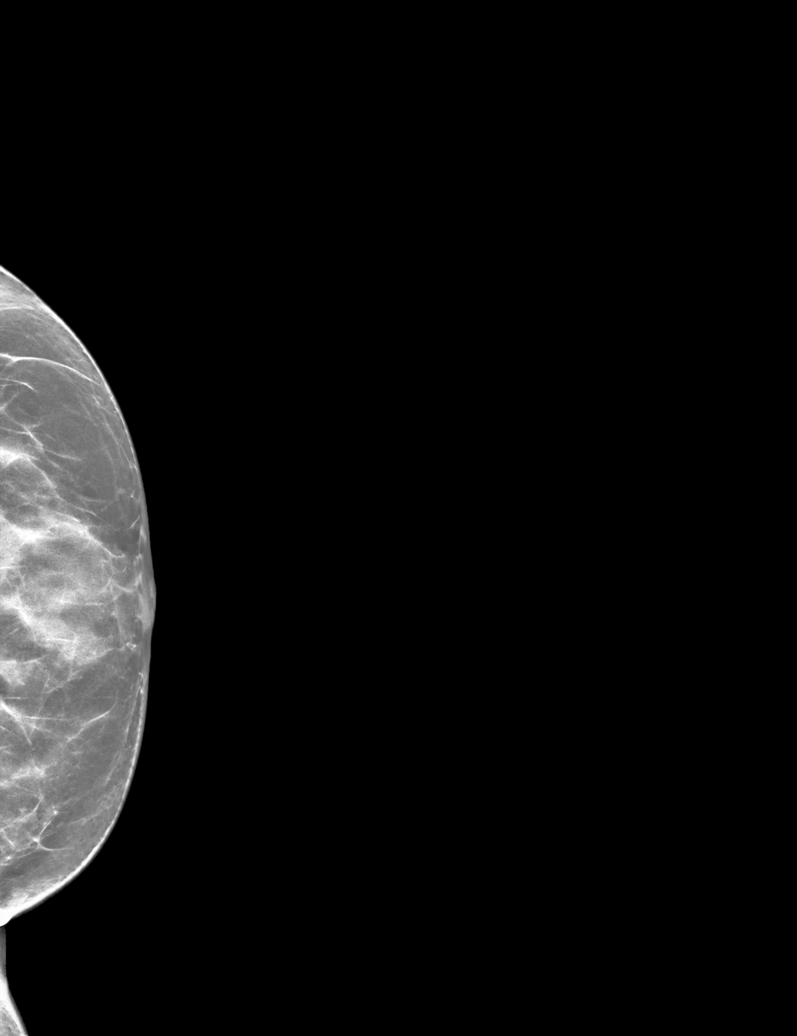

[L MLO synth-2D]
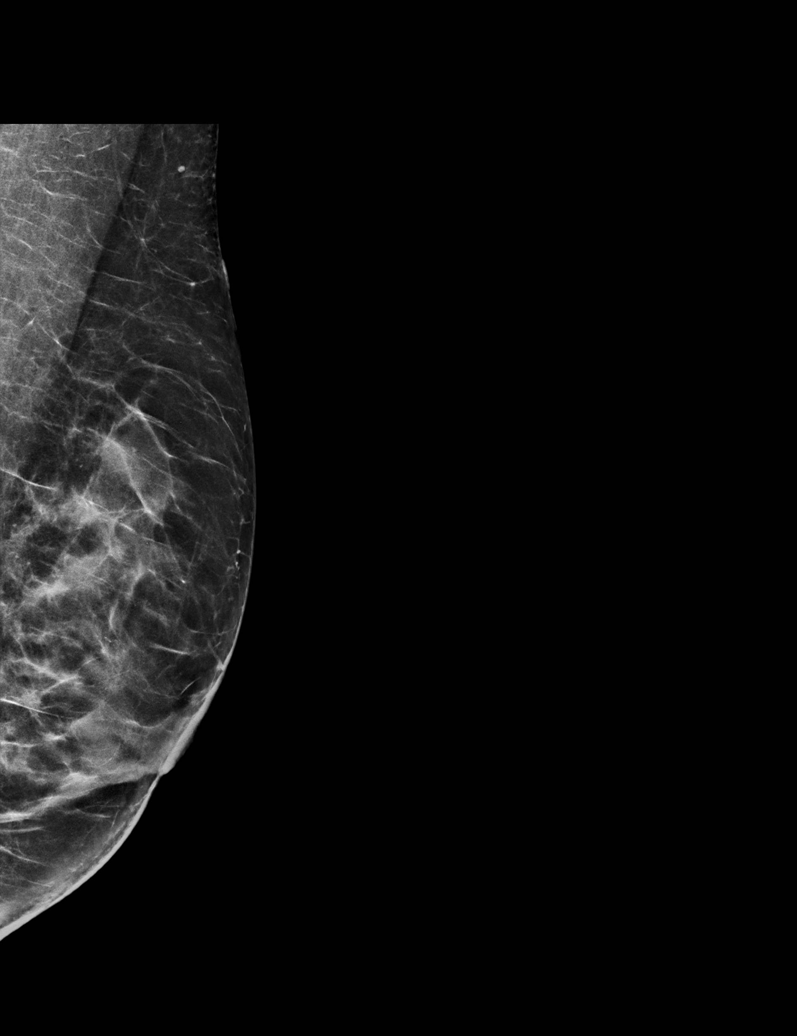

[6 of 36 positions shown; findings below may reference images not displayed]

ACR Breast Density Category c: The breast tissue is heterogeneously
dense, which may obscure small masses.
FINDINGS: There are no findings suspicious for malignancy.
IMPRESSION: No mammographic evidence of malignancy. A result letter of this
screening mammogram will be mailed directly to the patient.

RECOMMENDATION:
Screening mammogram in one year. (Code:[id number]-BC3)

BI-RADS CATEGORY  1: Negative.

## 2022-04-04 ENCOUNTER — Other Ambulatory Visit: Payer: Self-pay | Admitting: Family Medicine

## 2022-04-04 DIAGNOSIS — K219 Gastro-esophageal reflux disease without esophagitis: Secondary | ICD-10-CM

## 2022-04-18 ENCOUNTER — Other Ambulatory Visit: Payer: Self-pay

## 2022-04-18 NOTE — Telephone Encounter (Signed)
Patient Declined appointment on 04/17/2022

## 2022-04-18 NOTE — Telephone Encounter (Signed)
Patient decline establish care appointment on 7/25 she stated will be looking for new provider
